# Patient Record
Sex: Male | Born: 1990 | Race: Asian | Hispanic: No | Marital: Married | Smoking: Former smoker
Health system: Southern US, Community
[De-identification: ages and names within clinical notes are randomized; demographics above are authoritative.]

## PROBLEM LIST (undated history)

## (undated) DIAGNOSIS — F329 Major depressive disorder, single episode, unspecified: Secondary | ICD-10-CM

## (undated) DIAGNOSIS — K7400 Hepatic fibrosis, unspecified: Secondary | ICD-10-CM

## (undated) DIAGNOSIS — Z87442 Personal history of urinary calculi: Secondary | ICD-10-CM

## (undated) DIAGNOSIS — Z973 Presence of spectacles and contact lenses: Secondary | ICD-10-CM

## (undated) DIAGNOSIS — B1910 Unspecified viral hepatitis B without hepatic coma: Secondary | ICD-10-CM

## (undated) DIAGNOSIS — Q379 Unspecified cleft palate with unilateral cleft lip: Secondary | ICD-10-CM

## (undated) DIAGNOSIS — K219 Gastro-esophageal reflux disease without esophagitis: Secondary | ICD-10-CM

## (undated) DIAGNOSIS — F32A Depression, unspecified: Secondary | ICD-10-CM

## (undated) DIAGNOSIS — Z974 Presence of external hearing-aid: Secondary | ICD-10-CM

## (undated) DIAGNOSIS — R011 Cardiac murmur, unspecified: Secondary | ICD-10-CM

## (undated) HISTORY — PX: MANDIBLE SURGERY: SHX707

## (undated) HISTORY — PX: CLEFT LIP REPAIR: SHX5225

## (undated) HISTORY — DX: Major depressive disorder, single episode, unspecified: F32.9

## (undated) HISTORY — DX: Depression, unspecified: F32.A

## (undated) HISTORY — DX: Unspecified viral hepatitis B without hepatic coma: B19.10

---
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
## 2009-01-24 ENCOUNTER — Ambulatory Visit (HOSPITAL_COMMUNITY): Payer: Self-pay | Admitting: Psychiatry

## 2009-05-23 ENCOUNTER — Ambulatory Visit (HOSPITAL_COMMUNITY): Payer: Self-pay | Admitting: Psychiatry

## 2009-09-19 ENCOUNTER — Ambulatory Visit (HOSPITAL_COMMUNITY): Payer: Self-pay | Admitting: Psychiatry

## 2010-07-13 ENCOUNTER — Emergency Department (HOSPITAL_COMMUNITY)
Admission: EM | Admit: 2010-07-13 | Discharge: 2010-07-13 | Disposition: A | Discharge status: Other Acute Inpatient Hospital | Payer: Self-pay | Source: Home / Self Care | Admitting: Emergency Medicine

## 2010-07-13 ENCOUNTER — Inpatient Hospital Stay (HOSPITAL_COMMUNITY)
Admission: AD | Admit: 2010-07-13 | Discharge: 2010-07-17 | Payer: Self-pay | Source: Home / Self Care | Attending: Psychiatry | Admitting: Psychiatry

## 2010-09-21 LAB — CBC
HCT: 46.8 % (ref 39.0–52.0)
MCHC: 35.3 g/dL (ref 30.0–36.0)
RBC: 5.27 MIL/uL (ref 4.22–5.81)
WBC: 6.8 10*3/uL (ref 4.0–10.5)

## 2010-09-21 LAB — ETHANOL: Alcohol, Ethyl (B): <5 mg/dL (ref 0–10)

## 2010-09-21 LAB — DIFFERENTIAL
Basophils Relative: 1 % (ref 0–1)
Eosinophils Absolute: 0.3 10*3/uL (ref 0.0–0.7)
Eosinophils Relative: 4 % (ref 0–5)
Lymphocytes Relative: 15 % (ref 12–46)
Lymphs Abs: 1 10*3/uL (ref 0.7–4.0)
Monocytes Absolute: 0.7 10*3/uL (ref 0.1–1.0)
Monocytes Relative: 10 % (ref 3–12)

## 2010-09-21 LAB — BASIC METABOLIC PANEL
BUN: 12 mg/dL (ref 6–23)
Creatinine, Ser: 0.72 mg/dL (ref 0.4–1.5)
Glucose, Bld: 106 mg/dL — ABNORMAL HIGH (ref 70–99)
Potassium: 4.2 mEq/L (ref 3.5–5.1)

## 2010-09-21 LAB — RAPID URINE DRUG SCREEN, HOSP PERFORMED
Amphetamines: NOT DETECTED
Benzodiazepines: NOT DETECTED
Opiates: NOT DETECTED

## 2010-11-24 NOTE — H&P (Signed)
NAME:  [name], [name] NO.:  [id number]   MEDICAL RECORD NO.:  [id number]          PATIENT TYPE:  INP   LOCATION:  0204                          [facility]ILITY[facility]  [facility]   PHYSICIAN:  Lalla Brothers, MDDATE OF BIRTH:  30-Jun-1991   DATE OF ADMISSION:  04/05/2007  DATE OF DISCHARGE:                       PSYCHIATRIC ADMISSION ASSESSMENT   IDENTIFICATION:  This 20-year, 21-month-old male, 11th grade student at  [employer], is admitted emergently voluntarily on  referral from his therapist, Maryln Manuel, Ph.D. for inpatient  stabilization and treatment of suicide risk and depression.  The patient  has suicidal ideation to walk into traffic in order to die, which has  been recurrent since April 03, 2007.  He is unable to resolve  impulses or contract for safety.  The patient has been more depressed,  reporting few friends but seeming to associate with peers who have  attempted suicide or had suicide pacts, whether now or in the last year.  The patient is not communicating well with adoptive parents.   HISTORY OF PRESENT ILLNESS:  The patient only offers an immediate  association or trigger of male friend telling him April 03, 2007  that she was sick and had to have surgery.  The patient himself has a  highly ingrown left great toenail which has persistent the last three  months.  He was scheduled for surgery by podiatrist on April 06, 2007 at 0900.  However, the patient has decompensated whether thinking  about past surgeries such as his cleft lip and palate surgery or bone  graft to the maxilla in 1992 and 2002, respectively.  The patient  appears to be significantly avoidant as well as depressed.  He does not  open up and talk directly about the nature of his intrapsychic  experience.  The patient had a friend that attempted suicide by overdose  last year and apparently there was a suicide pact for the patient  surrounding some  of this activity and others.  The patient has been in  therapy with Maryln Manuel, Ph.D. every 2-3 weeks.  He has also  been treated for [facility]  including Dr. Tacy Learn, MD and Jolene Provost, Ph.D.  The patient is  currently taking Focalin 15 mg XR as 2 every morning.  He also takes  Claritin 10 mg in the morning if needed for allergy and ibuprofen p.r.n.  for pain.  He has taken Metadate in the past.  The patient does not  clarify his access to content and affect in therapy.  He has had  significant trauma and medical care for cleft lip and palate, requiring  multiple surgeries apparently starting in 1992 with a maxillary bone  graft from the pelvis in 2002.   PAST MEDICAL HISTORY:  The patient had chicken pox as an infant.  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  Apparently, his hepatitis B medical  status was resolved by 1996.  He has had hearing aids for significant  hearing loss,  orthodontic braces, and eyeglasses.  He had multiple  surgeries for his cleft lip and palate with the last being a bone graft  in 2002.  He has eyeglasses.  He has allergic rhinitis.  He has had  trivial aortic stenosis murmur.  He is not sexually active.  He had a  wild raccoon bite to his finger in June of 2007 apparently receiving  rabies shots.  The patient seems to have put off treatment for his left  great toe which is ingrown, both medially and laterally.  He reports  some pain up the left leg at times.  The patient seems apprehensive  about the surgical correction of that ingrown toenail but he will not  say this directly.  Father seems adamant that the patient get this  problem taken care of while the patient seems to be saying he is not  emotionally prepared.  The patient has had no known seizure or syncope.  He has had no heart murmur or arrhythmia.   REVIEW OF SYSTEMS:  The patient  denies difficulty with gait, gaze or  continence.  He denies exposure to communicable disease or toxins.  He  denies rash, jaundice or purpura at this time other than his ingrown  left great toenail.  He has had trivial aortic stenosis on previous  exam.  He suggests that he does comply with preventive care in this  regard but seemed somewhat lax about most responsibilities.   IMMUNIZATIONS:  Up-to-date.   FAMILY HISTORY:  The patient was adopted from an orphanage at 20 year of  age in the [place]  He apparently has some Catholic and [address]  background.  He seems somewhat ambivalent now about his adoptive family  though they are certainly highly invested in him.  Adoptive father is  retired and adoptive mother teaches at [employer].   SOCIAL AND DEVELOPMENTAL HISTORY:  The patient is an 11th grade student  at [employer].  He likes to play the violin and  enjoys [place] and classical music.  Grades have diminished to C's in math  and science currently.  He had no legal consequences.  He denies any  substance abuse including tobacco.  He does not acknowledge any sexual  activity.   ASSETS:  The patient has survival skills, including in his previous  surgery.   MENTAL STATUS EXAM:  Height is 159 cm and weight is 51.5 kg.  Blood  pressure is 125/66 with heart rate of 75 (sitting) and 125/74 with heart  rate of 85 (standing).  He is right-handed.  The patient has residual  defect in the central upper lip.  He has orthodontic braces.  He has  hearing aids and glasses.  Cranial nerves 2-12 are otherwise intact  except for his diminished hearing and visual acuity.  Muscle strengths  and tone are normal.  There are no pathologic reflexes or soft  neurologic findings currently.  There are no abnormal involuntary  movements.  Gait and gaze are intact.  The patient may be slightly  slowed cognitively as well as significant psychomotor slowing.  Still,  it is  difficult to assess cognitive status as he is avoidant and  depressed and does not participate.  The patient does not face his  triggers for despair or his associations with past pain.  Rather, he  associates in ways, particularly with negative peers that have faced  problems but become overwhelmed.  The patient seems to rely on their  resolution but they cannot help him.  The patient does not look to the  adoptive parents or professionals significantly for these solutions.  He  has atypical depressive features that are severe currently with  rejection sensitivity, hypersensitivity to the comments or actions of  others, impulse control difficulty, leaden fatigue, and hysteroid  denial.  The patient has no hallucinations that can be determined and no  manic diathesis.  He has no definite post-traumatic flashbacks but there  is always a reenactment pattern and identification with morbid death  themes and suicide.  The patient has suicidal ideation again that is  recent though in relapse from last year.  He has suicidal ideation to  walk into traffic to be killed.  He is not homicidal.   IMPRESSION:  AXIS I:  Major depression, single episode, severe with  atypical features.  Attention-deficit hyperactivity disorder, combined-  subtype, moderate severity.  Identity disorder with avoidant features.  Other interpersonal problem.  Other specified family circumstances.  Parent-child problem.  AXIS II:  Diagnosis deferred.  AXIS III:  Left great toe ingrown toenail, allergic rhinitis, hearing  loss, eyeglasses, orthodontic braces, cleft lip and palate, allergy to  PENICILLIN and AMOXICILLIN, history of hepatitis B, history of trivial  aortic stenosis murmur.  AXIS IV:  Stressors:  School--moderate, acute and chronic; family--  severe, acute and chronic; phase of life--severe, acute and chronic;  medical-extreme, acute and chronic; peer relations--severe, acute and  chronic.  AXIS V:  GAF on  admission 33; highest in last year estimated at 60.   PLAN:  The patient is admitted for inpatient adolescent psychiatric and  multidisciplinary multimodal behavioral health treatment in a team-based  programmatic locked psychiatric unit.  Will continue Focalin and start  Prozac 20 mg daily as discussed at length with adoptive mother including  FDA guidelines and warnings.  Wound culture is planned for the toe  though the primary treatment need appears to be the podiatric surgery  debridement of the ingrown nail.  Cognitive behavioral therapy,  desensitization, anger mobilization and management, interpersonal  therapy, object relations, family therapy, individuation separation,  identity consolidation, social and communication skill training, problem-  solving and coping skill training, and learning strategies can be  undertaken.   ESTIMATED LENGTH OF STAY:  Six to seven days with target symptom for  discharge being stabilization of suicide risk and mood, stabilization of  anxious avoidance and generalization of the capacity for safe effective  participation in outpatient treatment.      Lalla Brothers, MD  Electronically Signed     GEJ/MEDQ  D:  04/06/2007  T:  04/07/2007  Job:  [id number]

## 2010-11-24 NOTE — Discharge Summary (Signed)
NAME:  [name], GOOSTREE NO.:  [id number]   MEDICAL RECORD NO.:  [id number]          PATIENT TYPE:  INP   LOCATION:  0204                          [facility]ILITY[facility]   PHYSICIAN:  Lalla Brothers, MDDATE OF BIRTH:  May 04, 1991   DATE OF ADMISSION:  04/05/2007  DATE OF DISCHARGE:  04/12/2007                               DISCHARGE SUMMARY   IDENTIFICATION:  This 20-year, 13-month-old male, 11th grade student at  [employer], was admitted emergently voluntarily on  referral from Maryln Manuel, Ph.D. for inpatient stabilization and  treatment of suicide risk and depression.  The patient had a suicide  plan to walk into traffic to die, recurrent since April 03, 2007,  unable to contract for safety at this time.  He has had difficulty with  peer relations and is not communicating well with adults including  adoptive parents, now seeming to associate with depressed or suicidal  peers including a suicide pact in the past.  The patient has previous  surgery for cleft lip and palate and apparently more surgery in the  future.  He is currently scheduled to have podiatric surgery on his left  great toe the day after admission and has been apparently putting this  off for three months.  The patient becomes more stressed and distressed  as this pattern continues.  He is being treated for [facility] at [facility], taking Focalin 30 mg XR every  morning.  For full details, please see the typed admission assessment.   SYNOPSIS OF PRESENT ILLNESS:  The patient was adopted in the [place]  from an orphanage at 20 year of age, having a history of hepatitis B in  early life and his cleft lip and palate surgery in 1992 and subsequently  with bone graft in 2002.  He was treated with Metadate in the past and  currently Focalin, working primarily with Dr. Virgel Paling.  He has a  history of cardiac murmur whether initially related  as aortic stenosis  or subsequently pulmonic stenosis.  The patient has no active medical  concerns currently except his ingrown left great toenail, his  orthodontic braces and upcoming need for further cleft lip surgery,  allergy to PENICILLIN and AMOXICILLIN, and some chronic hearing loss and  need for eyeglasses.  He does take Claritin occasionally for allergic  rhinitis.  The patient is closed to communication about his emotional  concerns.  He is intense and at times perfectionistic though he has  musical skills and is interested in church youth group.  He is not  studying or getting his school work in on time.  No other definite  family history is known.   INITIAL MENTAL STATUS EXAM:  The patient was slowed cognitively,  consistent with psychomotor slowing from depression.  However, he is  genuine regarding concern for peer problems though may become  overwhelmed with these.  He is not looking to adults in his life for  support or guidance.  He has atypical depressive features with rejection  sensitivity, hypersensitivity to the comments or actions of others,  leaden fatigue and hysteroid denial.  He is not self-injurious or  assaultive but has been experiencing suicidal ideation almost in relapse  from last year with morbid themes of death such as walking into traffic.  He is right-handed and neurological exam was otherwise intact.   LABORATORY FINDINGS:  CBC on admission was normal except hemoglobin  slightly elevated at 16.6 with upper limit of normal 16 consistent with  hemoconcentration.  Total white count was normal at 5900, MCV at 85.5  and platelet count 199,000.  Hepatic function panel was normal with  albumin 4.3, total bilirubin 1, AST 20, ALT 13 and GGT 20.  Basic  metabolic panel was normal with sodium 143, potassium 4, fasting glucose  87, creatinine 0.85 and calcium 9.8.  Free T4 was normal at 1.38 and TSH  at 1.899.  RPR was nonreactive and urine probe for  gonorrhea and  chlamydia trachomatis by DNA amplification were both negative.  Urine  drug screen was negative with creatinine of 242 mg/dL documenting  adequate specimen.  Wound culture of the left great toe final report  noted a few Staphylococcus aureus resistant to penicillin and  tetracycline, otherwise sensitive including to oxacillin.  There were  also a few Pseudomonas Auerginosa sensitive to all antibiotics tested.  EKG interpreted by Dr. Michelle Piper Degent was normal sinus rhythm, borderline  EKG due to incomplete right bundle branch block with QRS of 100  milliseconds, QTC of 411 milliseconds and rate of 89 milliseconds with  PR of 112 milliseconds.   HOSPITAL COURSE AND TREATMENT:  General medical exam by Jorje Guild, PA-C  noted allergy to AMOXICILLIN and a p.r.n. use of ibuprofen when needed.  The patient reported that biological mother was mentally retarded and  that he has some conflicts with adoptive father.  He has eyeglasses and  diminished hearing.  He apparently received growth hormone in the past.  BMI is 20.4.  He has some facial acne.  He has continued significant  underbite and deficit in the upper lip.  He has hearing aids bilaterally  and orthodontic braces.  Phone discussion with his podiatrist addressed  culture, Septra antibiotic in the interim and podiatric surgery upon  discharge for the ingrown left great toenail affecting the medial and  lateral sides.  The wound was cleaned and dressed daily.  Vital signs  were normal throughout hospital stay, remaining afebrile with maximum  temperature 98.2.  His height was 159 cm and admission weight was 51.5  kg with discharged 49 kg with the patient disapproving of hospital food.  Initial blood pressure (supine) was 117/73 with heart rate of 51 and  standing blood pressure was 115/74 with heart rate of 67.  At the time  of discharge, supine blood pressure was 103/57 with heart rate of 92 and  standing blood pressure 109/62  with heart rate of 123.  The patient did  have some tachycardia most prominent on April 10, 2007 at which time  his sitting blood pressure was 155/86 with heart rate of 135 and  standing blood pressure was 118/79 with heart rate of 154.  His Focalin  was decreased from 30 mg to 20 mg XR initially and finally to 15 mg XR  as Prozac 20 mg every morning was started.  The Prozac was switched to  bedtime as the family finds this a convenient time for also taking their  medications.  The patient reported being initially drowsy on the Prozac.  At the time of discharge,  the drowsiness was resolved but the patient  had some stereotypic oral movements that were not typical tics but  rather appeared to be stereotypies that would likely remit.  The patient  had no other side effects from the Prozac.  The patient worked  diligently in the hospital treatment program and was able to focus his  improvement in skill and confidence upon family work, particularly in  the final family therapy session.  Adoptive father was willing to  relinquish his requirement for immediate surgery on the left great toe  and to allow this to be completed after the patient's discharge.  Parents reworked many of the experiences involved in the care of the  patient over the years and all assessed the patient's maturity and  progress over time though frustration sometime a persisting problem.  The patient was pleased overall with his progress and prepared for  discharge.  He had no remaining suicidal ideation, and was discharged in  improved condition with a copy of his laboratory testing to take to his  upcoming appointment with podiatrist and then pediatrician in the  future.  He required no seclusion or restraint during the hospital stay.   FINAL DIAGNOSES:  AXIS I:  Major depression, single episode, severe with  atypical features.  Attention-deficit hyperactivity disorder, combined-  subtype, with moderate severity.   Other interpersonal problem.  Other  specified family circumstances.  Parent-child problem.  AXIS II:  Diagnosis deferred.  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  AXIS IV:  Stressors:  School--moderate, acute and chronic; family--  severe, acute and chronic; phase of life--severe, acute and chronic;  medical--extreme, acute and chronic; peer relations--severe, acute and  chronic.  AXIS V:  GAF on admission 33; highest in last year 60; discharge GAF 53.   CONDITION ON DISCHARGE:  The patient was discharged to adoptive parents  in improved condition on a weight gain diet.  He has no restrictions on  physical activity other than those requiring upcoming care such as for  his left great toenail and cleft palate and cleft lip repair.  He has no  other activity restrictions and wound care will be deferred subsequently  to his podiatric surgery in the next day or two with Dr. Tinnie Gens  Petrinitz with a copy of labs and EKG sent with the patient and family  for upcoming procedure.  He has ongoing pediatric care with Dr. Talmage Nap.  Crisis and safety plans are outlined if needed as well as medication  education being provided including FDA guidelines and warnings.  He is  prescribed the following medication.   DISCHARGE MEDICATIONS:  1. Focalin 15 mg XR capsule every morning; quantity #30 with no refill      prescribed, having been reduced from 30 mg XR prior to admission.  2. Prozac 20 mg every bedtime as the oral solution 20 mg per teaspoon       being prescribed 150 cc with one refill.  3. Claritin 10 mg daily as needed for allergy symptoms; own home      supply.  4. Ibuprofen; as per own home supply for pain if needed.  5. He completed six days of Septra DS, changed to Septra suspension on      a b.i.d. regimen during his hospital stay.   FOLLOWUP:  The patient will see Dr. Everlene Other for therapy April 17, 2007 at 1600.  He will see Dr. Betti Cruz at [facility] for  psychiatric follow-up with parents required to call to schedule that  appointment at [phone].      Lalla Brothers, MD  Electronically Signed     GEJ/MEDQ  D:  04/14/2007  T:  04/14/2007  Job:  [id number]

## 2010-11-27 NOTE — Op Note (Signed)
NAME:  [name], [name] NO.:  [id number]   MEDICAL RECORD NO.:  [id number]                   PATIENT TYPE:  OIB   LOCATION:  2873                                 FACILITY:  [facility]   PHYSICIAN:  Prabhakar D. Pendse, M.D.           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   OPERATION PERFORMED:  Excision of left preauricular lesion and layered  repair.   SURGEON:  Prabhakar D. Levie Heritage, M.D.   ASSISTANT:  Nurse.   ANESTHESIA:  Nurse.   INDICATIONS FOR PROCEDURE:  This young 20 year old patient had excision of  left preauricular cyst about two years ago.  The wound healed with keloid  like lesion; however, recently he has been complaining of pain and  discomfort.  Ultrasound examination was carried out which revealed findings  consistent with possible recurrence of the cyst, hence excision was planned.   DESCRIPTION OF PROCEDURE:  Patient under satisfactory general anesthesia in  lateral position, left face region was thoroughly prepped and draped in the  usual manner.  An elliptical incision was made around the lesion.  Skin and  subcutaneous tissue were incised.  Bleeders were individually clamped, cut  and electrocoagulated.  By blunt and sharp dissection incision carried down  to the fascia.  Possible superficial parotid gland capsule was adherent to  the lesion. The entire lesion was excised.  Hemostasis accomplished.  The  deeper layers approximated with 4-0 Vicryl.  Skin closed with 5-0 nylon  interrupted as well as running interlocking sutures.  Occlusive dressing  applied.  Throughout the procedure,  the patient's vital signs remained  stable.  The patient withstood the procedure well and  was transferred to  the recovery room in satisfactory general condition.                                               Prabhakar D. Levie Heritage, M.D.    PDP/MEDQ  D:  07/04/2003  T:  07/05/2003  Job:  [id number]   cc:   Alden Server L. Peter Congo, M.D.  [address]  [place]  [place] [address]  Fax: [phone]

## 2011-04-22 LAB — CBC
Hemoglobin: 16.6 — ABNORMAL HIGH
MCHC: 35.4
MCV: 85.5
RBC: 5.47

## 2011-04-22 LAB — RPR: RPR Ser Ql: NONREACTIVE

## 2011-04-22 LAB — HEPATIC FUNCTION PANEL
AST: 20
Albumin: 4.3
Total Bilirubin: 1
Total Protein: 6.8

## 2011-04-22 LAB — BASIC METABOLIC PANEL
CO2: 29
Chloride: 106
Creatinine, Ser: 0.85

## 2011-04-22 LAB — WOUND CULTURE: Gram Stain: NONE SEEN

## 2011-04-22 LAB — GAMMA GT: GGT: 20

## 2011-04-22 LAB — DRUGS OF ABUSE SCREEN W/O ALC, ROUTINE URINE
Benzodiazepines.: NEGATIVE
Marijuana Metabolite: NEGATIVE
Methadone: NEGATIVE
Opiate Screen, Urine: NEGATIVE

## 2011-04-22 LAB — DIFFERENTIAL
Basophils Relative: 1
Lymphocytes Relative: 24

## 2012-07-24 ENCOUNTER — Telehealth (HOSPITAL_COMMUNITY): Payer: Self-pay

## 2014-04-19 ENCOUNTER — Encounter: Payer: Self-pay | Admitting: *Deleted

## 2014-08-03 ENCOUNTER — Emergency Department (HOSPITAL_COMMUNITY): Payer: BLUE CROSS/BLUE SHIELD

## 2014-08-03 ENCOUNTER — Encounter (HOSPITAL_COMMUNITY): Payer: Self-pay | Admitting: Emergency Medicine

## 2014-08-03 ENCOUNTER — Emergency Department (HOSPITAL_COMMUNITY)
Admission: EM | Admit: 2014-08-03 | Discharge: 2014-08-03 | Disposition: A | Discharge status: Home / Self Care | Payer: BLUE CROSS/BLUE SHIELD | Attending: Emergency Medicine | Admitting: Emergency Medicine

## 2014-08-03 DIAGNOSIS — Z8619 Personal history of other infectious and parasitic diseases: Secondary | ICD-10-CM | POA: Diagnosis not present

## 2014-08-03 DIAGNOSIS — Z23 Encounter for immunization: Secondary | ICD-10-CM | POA: Insufficient documentation

## 2014-08-03 DIAGNOSIS — S0031XA Abrasion of nose, initial encounter: Secondary | ICD-10-CM | POA: Diagnosis not present

## 2014-08-03 DIAGNOSIS — W540XXA Bitten by dog, initial encounter: Secondary | ICD-10-CM | POA: Insufficient documentation

## 2014-08-03 DIAGNOSIS — Y9389 Activity, other specified: Secondary | ICD-10-CM | POA: Diagnosis not present

## 2014-08-03 DIAGNOSIS — Y998 Other external cause status: Secondary | ICD-10-CM | POA: Insufficient documentation

## 2014-08-03 DIAGNOSIS — Z8659 Personal history of other mental and behavioral disorders: Secondary | ICD-10-CM | POA: Diagnosis not present

## 2014-08-03 DIAGNOSIS — Y9289 Other specified places as the place of occurrence of the external cause: Secondary | ICD-10-CM | POA: Diagnosis not present

## 2014-08-03 DIAGNOSIS — Z87891 Personal history of nicotine dependence: Secondary | ICD-10-CM | POA: Diagnosis not present

## 2014-08-03 DIAGNOSIS — S0992XA Unspecified injury of nose, initial encounter: Secondary | ICD-10-CM | POA: Diagnosis present

## 2014-08-03 DIAGNOSIS — T148XXA Other injury of unspecified body region, initial encounter: Secondary | ICD-10-CM

## 2014-08-03 MED ORDER — KETOROLAC TROMETHAMINE 30 MG/ML IJ SOLN: 30.0000 mg | Freq: Once | INTRAMUSCULAR | Status: DC

## 2014-08-03 MED ORDER — BACITRACIN 500 UNIT/GM EX OINT
1.0000 "application " | TOPICAL_OINTMENT | Freq: Two times a day (BID) | CUTANEOUS | Status: DC
  Filled 2014-08-03 (×3): qty 0.9

## 2014-08-03 MED ORDER — ONDANSETRON HCL 4 MG/2ML IJ SOLN: 4.0000 mg | Freq: Once | INTRAMUSCULAR | Status: DC

## 2014-08-03 MED ORDER — TETANUS-DIPHTH-ACELL PERTUSSIS 5-2.5-18.5 LF-MCG/0.5 IM SUSP
0.5000 mL | Freq: Once | INTRAMUSCULAR | Status: AC
  Administered 2014-08-03: 0.5 mL via INTRAMUSCULAR
  Filled 2014-08-03: qty 0.5

## 2014-08-03 MED ORDER — AMOXICILLIN-POT CLAVULANATE 875-125 MG PO TABS: 1.0000 | ORAL_TABLET | Freq: Two times a day (BID) | ORAL | Status: DC

## 2014-08-03 MED ORDER — HYDROMORPHONE HCL 1 MG/ML IJ SOLN: 1.0000 mg | INTRAMUSCULAR | Status: DC | PRN

## 2014-08-03 MED ORDER — AMOXICILLIN-POT CLAVULANATE 875-125 MG PO TABS
1.0000 | ORAL_TABLET | Freq: Once | ORAL | Status: AC
  Administered 2014-08-03: 1 via ORAL
  Filled 2014-08-03: qty 1

## 2014-08-03 NOTE — Discharge Instructions (Signed)
Please call your doctor for a followup appointment within 24-48 hours. When you talk to your doctor please let them know that you were seen in the emergency department and have them acquire all of your records so that they can discuss the findings with you and formulate a treatment plan to fully care for your new and ongoing problems. Please follow-up with your primary care provider to be seen and reassessed Please continue to wash wounds with warm water and soap every day, continue to apply bacitracin ointment Please continue to monitor symptoms closely and if symptoms are to worsen or change (fever greater than 101, chills, sweating, nausea, vomiting, chest pain, shortness of breathe, difficulty breathing, weakness, numbness, tingling, worsening or changes to pain pattern, swelling, headache, dizziness, drainage, pus, redness spreads to the face) please report back to the Emergency Department immediately.    Animal Bite An animal bite can result in a scratch on the skin, deep open cut, puncture of the skin, crush injury, or tearing away of the skin or a body part. Dogs are responsible for most animal bites. Children are bitten more often than adults. An animal bite can range from very mild to more serious. A small bite from your house pet is no cause for alarm. However, some animal bites can become infected or injure a bone or other tissue. You must seek medical care if:  The skin is broken and bleeding does not slow down or stop after 15 minutes.  The puncture is deep and difficult to clean (such as a cat bite).  Pain, warmth, redness, or pus develops around the wound.  The bite is from a stray animal or rodent. There may be a risk of rabies infection.  The bite is from a snake, raccoon, skunk, fox, coyote, or bat. There may be a risk of rabies infection.  The person bitten has a chronic illness such as diabetes, liver disease, or cancer, or the person takes medicine that lowers the immune  system.  There is concern about the location and severity of the bite. It is important to clean and protect an animal bite wound right away to prevent infection. Follow these steps:  Clean the wound with plenty of water and soap.  Apply an antibiotic cream.  Apply gentle pressure over the wound with a clean towel or gauze to slow or stop bleeding.  Elevate the affected area above the heart to help stop any bleeding.  Seek medical care. Getting medical care within 8 hours of the animal bite leads to the best possible outcome. DIAGNOSIS  Your caregiver will most likely:  Take a detailed history of the animal and the bite injury.  Perform a wound exam.  Take your medical history. Blood tests or X-rays may be performed. Sometimes, infected bite wounds are cultured and sent to a lab to identify the infectious bacteria.  TREATMENT  Medical treatment will depend on the location and type of animal bite as well as the patient's medical history. Treatment may include:  Wound care, such as cleaning and flushing the wound with saline solution, bandaging, and elevating the affected area.  Antibiotics.  Tetanus immunization.  Rabies immunization.  Leaving the wound open to heal. This is often done with animal bites, due to the high risk of infection. However, in certain cases, wound closure with stitches, wound adhesive, skin adhesive strips, or staples may be used. Infected bites that are left untreated may require intravenous (IV) antibiotics and surgical treatment in the hospital. HOME  CARE INSTRUCTIONS  Follow your caregiver's instructions for wound care.  Take all medicines as directed.  If your caregiver prescribes antibiotics, take them as directed. Finish them even if you start to feel better.  Follow up with your caregiver for further exams or immunizations as directed. You may need a tetanus shot if:  You cannot remember when you had your last tetanus shot.  You have  never had a tetanus shot.  The injury broke your skin. If you get a tetanus shot, your arm may swell, get red, and feel warm to the touch. This is common and not a problem. If you need a tetanus shot and you choose not to have one, there is a rare chance of getting tetanus. Sickness from tetanus can be serious. SEEK MEDICAL CARE IF:  You notice warmth, redness, soreness, swelling, pus discharge, or a bad smell coming from the wound.  You have a red line on the skin coming from the wound.  You have a fever, chills, or a general ill feeling.  You have nausea or vomiting.  You have continued or worsening pain.  You have trouble moving the injured part.  You have other questions or concerns. MAKE SURE YOU:  Understand these instructions.  Will watch your condition.  Will get help right away if you are not doing well or get worse. Document Released: 03/16/2011 Document Revised: 09/20/2011 Document Reviewed: 03/16/2011 [facility] Patient Information 2015 [place], [place]. This information is not intended to replace advice given to you by your health care provider. Make sure you discuss any questions you have with your health care provider.   Emergency Department Resource Guide 1) Find a Doctor and Pay Out of Pocket Although you won't have to find out who is covered by your insurance plan, it is a good idea to ask around and get recommendations. You will then need to call the office and see if the doctor you have chosen will accept you as a new patient and what types of options they offer for patients who are self-pay. Some doctors offer discounts or will set up payment plans for their patients who do not have insurance, but you will need to ask so you aren't surprised when you get to your appointment.  2) Contact Your Local Health Department Not all health departments have doctors that can see patients for sick visits, but many do, so it is worth a call to see if yours does. If you don't  know where your local health department is, you can check in your phone book. The CDC also has a tool to help you locate your state's health department, and many state websites also have listings of all of their local health departments.  3) Find a Walk-in Clinic If your illness is not likely to be very severe or complicated, you may want to try a walk in clinic. These are popping up all over the country in pharmacies, drugstores, and shopping centers. They're usually staffed by nurse practitioners or physician assistants that have been trained to treat common illnesses and complaints. They're usually fairly quick and inexpensive. However, if you have serious medical issues or chronic medical problems, these are probably not your best option.  No Primary Care Doctor: - Call Health Connect at  [phone] - they can help you locate a primary care doctor that  accepts your insurance, provides certain services, etc. - Physician Referral Service- [phone]  Chronic Pain Problems: Organization         Address  Phone   Notes  [facility]  [phone] Patients need to be referred by their primary care doctor.   Medication Assistance: Organization         Address  Phone   Notes  [facility] Medication [facility] [address] [place], [place] [address] [phone] --Must be a resident of [facility] -- Must have NO insurance coverage whatsoever (no Medicaid/ Medicare, etc.) -- The pt. MUST have a primary care doctor that directs their care regularly and follows them in the community   [facility]  [phone]   [employer]  [phone]    Agencies that provide inexpensive medical care: Organization         Address  Phone   Notes  [facility]  [phone]   [facility]    [phone]   [facility] [address] [place], [place] [address] [phone]   [facility] [address], [place] [phone]   Planned Parenthood    [phone]   [facility]    [phone]   Community Health and [facility]  [address], [place] Phone:  [phone], Fax:  [phone] Hours of Operation:  9 am - 6 pm, M-F.  Also accepts Medicaid/Medicare and self-pay.  [facility]  [address], [place] Phone: [phone], Fax: [phone]. Hours of Operation:  8:30 am - 5:30 pm, M-F.  Also accepts Medicaid and self-pay.  [facility] [place] [address] [place] Phone: [phone]   [facility] [address] Natasha Bence [place], [place] [phone], Ext. [phone] Mondays & Thursdays: 7-9 AM.  First 15 patients are seen on a first come, first serve basis.    Medicaid-accepting [facility] Providers:  Organization         Address  Phone   Notes  [facility] [address], [place] [phone] Also accepts self-pay patients.  [facility] [address] Laurell Josephs [place], [place]  [phone]   [facility] [address], [place] [phone]   [facility] [address], [place] [phone]   Renaye Rakers [address], [place]   [phone] Only accepts [place] Access [place] patients after they have their name applied to their card.   Self-Pay (no insurance) in [facility]:  Organization         Address  Phone   Notes  Sickle Cell Patients, [facility] [address] [place], [place] [phone]   [facility] [address] [place], [place] [phone]   [facility] [place]  [address], [place] [phone]   [facility]/Dr. Osei-Bonsu  [address] [place] or [address], [place] [phone] Phone  number for both [place] and [place] locations is the same.  Urgent Medical and [facility] [address], [place] [phone]   [facility] [address] [place] or [address] [phone]   [facility] [address], [place] [phone], phone; [phone], fax Sees patients 1st and 3rd Saturday of every month.  Must not qualify for public  or private insurance (i.e. Medicaid, Medicare, [place] Health Choice, Veterans' Benefits)  Household income should be no more than 200% of the poverty level The clinic cannot treat you if you are pregnant or think you are pregnant  Sexually transmitted diseases are not treated at the clinic.    Dental Care: Organization         Address  Phone  Notes  [facility] [address], [place] [phone] Accepts children up to age 25 who are enrolled in [place] or [place] Health Choice; pregnant women with a Medicaid card; and children who have applied for Medicaid or [place] Health Choice, but were declined, whose parents can pay a reduced fee at time of service.  [facility] [place]  [address], [place] [phone] Accepts children up to age 25 who are enrolled in [place] or [place] Health Choice; pregnant women with a Medicaid card; and children who have applied for Medicaid or  Health Choice, but were declined, whose parents can pay a reduced fee at time of service.  [facility]  [address], [place] [phone] Patients are seen by appointment only. Walk-ins are not accepted. [facility] will see patients 25 years of age and older. Monday - Tuesday (8am-5pm) Most Wednesdays (8:30-5pm) $30 per visit, cash only  [facility]  [address], [facility] [place] [phone] Patients are seen by appointment only.  Walk-ins are not accepted. [facility] will see patients 25 years of age and older. One Wednesday Evening (Monthly: Volunteer Based).  $30 per visit, cash only  [employer] [facility] [facility]  [phone] for adults; Children under age 274, call Graduate Pediatric Dentistry at [phone]. Children aged 64-14, please call [phone] to request a pediatric application.  Dental services are provided in all areas of dental care including fillings, crowns and bridges, complete and partial dentures, implants, gum treatment, root canals, and extractions. Preventive care is also provided. Treatment is provided to both adults and children. Patients are selected via a lottery and there is often a waiting list.   [facility] [address], [place]  [phone] [email or web address]   [facility] [address], [place], [place] [phone], Ext. [phone] Second and Fourth Thursday of each month, opens at 6:30 AM; Clinic ends at 9 AM.  Patients are seen on a first-come first-served basis, and a limited number are seen during each clinic.   [facility]  [address], [place], [place] [phone]   Eligibility Requirements You must have lived in [place], [place], or [place] counties for at least the last three months.   You cannot be eligible for state or federal sponsored [place]healthcare insurance, including CIGNAVeterans Administration, [place]icaid, or [employer]icare.   You generally cannot be eligible for healthcare insurance through your employer.    How to apply: Eligibility screenings are held every Tuesday and Wednesday afternoon from 1:00 pm until 4:00 pm. You do not need an appointment for the interview!  [facility] [address], [place], [place] [phone]   [facility]  [phone]   [facility]  [phone]   [facility]  [phone]    Behavioral Health  Resources in the Community: Intensive Outpatient Programs Organization         Address  Phone  Notes  [facility] [address] [place], [place] [phone]   [facility] Outpatient [address] [place], [place] [phone]   [facility] [address], [place], [place]  [phone]   [facility] [address],  [place], [place] [phone] or [phone]   Substance Abuse Resources Organization         Address  Phone  Notes  [facility] Drug Services  [phone]   [facility]  [phone]   [facility]  [phone]   Floydene Flock  [phone]   Residential & Outpatient Substance Abuse Program  [phone]   Psychological Services Organization         Address  Phone  Notes  [facility]  [phone]   [facility]  [phone]   [facility] [address], [place] [phone] or [phone]    Mobile Crisis Teams Organization         Address  Phone  Notes  [facility], Mobile Crisis Care Unit  [phone]   [facility]  [address] [place], [place] [phone]   Doristine Locks [address] [place] [phone]    Self-Help/Support Groups Organization         Address  Phone             Notes  [facility] [employer] - variety of support groups  336- I7437963 Call for more information  Narcotics Anonymous (NA), [facility] [address], [employer] [place]  2 meetings at this location   Statistician         Address  Phone  Notes  [facility] [address],    [place]  [phone]   [facility]  [address], [place] [address], [place], [place] [phone]   [facility] [address] [place], [place] [phone] Admissions: 8am-3pm M-F  [facility] [address],    [place], [place] [phone]   [facility] [address] [place], [place], [place] [phone]   [facility] [address]  [place], [place] [phone]   [facility] - Intensive Outpatient [address]ure[address] Joseph[address], [place], [place] [phone]   [facility] ([facility] [address]  [place], [place] [phone] or [phone]   [facility] ([facility]) [address] [place], [place] [phone] Accepts Medicaid  [facility] [address]  [place] [phone] Substance Abuse/Addiction Treatment   [facility] Organization         Address  Phone  Notes  [facility]  [phone]   Angie Fava, PhD [address] Ervin Knack [place], [place]   [phone] or [phone]   [facility]   [address] [place], [place] [phone]   [facility] [address], [place], [place] [phone] Insurance/Medicaid/sponsorship through [facility] Families [address]                                    [place], [place] [phone] Therapy/tele-psych/case  [facility] [address], [place] [phone]    Dr. Lolly Mustache  [phone]   [facility]  [employer] [facility]  Dept. 1) [address], [place] °2) [address], [place] °3)  [address], [place] [phone] °([phone] ° °([phone]) [phone]   °[place] Child Abuse Hotline [phone]) 342-1394 or ([phone] (After Hours)     ° ° ° °

## 2014-08-03 NOTE — ED Notes (Signed)
Pt to CT and returned.  

## 2014-08-03 NOTE — ED Provider Notes (Signed)
CSN: [id number]     Arrival date & time 08/03/14  1216 History   First MD Initiated Contact with Patient 08/03/14 1226     Chief Complaint  Patient presents with  . Animal Bite     (Consider location/radiation/quality/duration/timing/severity/associated sxs/prior Treatment) The history is provided by the patient. No language interpreter was used.  [name] is a 24 year old male with past history of depression, hepatitis B presents to the emergency department regarding a dog bite that occurred yesterday evening at approximately 6:30 PM. Patient reported that he was playing with his friend's dog, reported that the dog got scared and bit his nose. Patient reported that there was a lot of bleeding, was controlled with applying pressure. Patient stated that he's been cleaning the wounds with warm water and soap and applying Neosporin. Stated that he wanted to come to the ED today to be seen regarding possible nose damage. Father who accompanies patient, reports that he is concerned that the patient has cartilage damage. Patient reported that the dog is up-to-date with vaccinations-rabies vaccine is up-to-date. Patient reported that he has not received his tetanus shot less than 5 years ago-reported that this was more than 5 years ago. Denied breathing issues, issues with nostrils, blurred vision, subtle loss of vision, neck pain, neck stiffness, swelling , drainage. PCP none  Past Medical History  Diagnosis Date  . Hepatitis B   . Depression    History reviewed. No pertinent past surgical history. Family History  Problem Relation Age of Onset  . Hyperlipidemia Mother    History  Substance Use Topics  . Smoking status: Former Smoker    Quit date: 04/19/2012  . Smokeless tobacco: Not on file  . Alcohol Use: No    Review of Systems  Constitutional: Negative for fever and chills.  Eyes: Negative for visual disturbance.  Skin: Positive for wound.  Neurological: Negative for  weakness and numbness.      Allergies  Review of patient's allergies indicates no known allergies.  Home Medications   Prior to Admission medications   Medication Sig Start Date End Date Taking? Authorizing Provider  loratadine (CLARITIN) 10 MG tablet Take 10 mg by mouth daily as needed for allergies.    Yes Historical Provider, MD  amoxicillin-clavulanate (AUGMENTIN) 875-125 MG per tablet Take 1 tablet by mouth 2 (two) times daily. One po bid x 7 days 08/03/14   Cheril Slattery, PA-C   BP 141/73 mmHg  Pulse 108  Temp(Src) 98 F (36.7 C) (Oral)  Resp 17  SpO2 100% Physical Exam  Constitutional: He is oriented to person, place, and time. He appears well-developed and well-nourished. No distress.  HENT:  Head: Normocephalic and atraumatic.  Mouth/Throat: Oropharynx is clear and moist. No oropharyngeal exudate.  Superficial abrasions identified to the nose, tip of the nose and left nostril. Negative active draining or bleeding noted at this time. Abrasions have scabbed over. Mild erythema surrounds the superficial abrasions. Negative pain upon palpation. Negative septal hematoma. Nares intact with patency. Negative active drainage or bleeding noted to the nearest bilaterally.  Cleft palate repair identified.  Eyes: Conjunctivae and EOM are normal. Pupils are equal, round, and reactive to light. Right eye exhibits no discharge. Left eye exhibits no discharge.  Neck: Normal range of motion. Neck supple. No tracheal deviation present.  Negative neck stiffness Negative nuchal rigidity Negative cervical lymphadenopathy  Cardiovascular: Normal rate, regular rhythm and normal heart sounds.  Exam reveals no friction rub.   No murmur heard. Pulmonary/Chest:  Effort normal and breath sounds normal. No respiratory distress. He has no wheezes. He has no rales.  Musculoskeletal: Normal range of motion.  Lymphadenopathy:    He has no cervical adenopathy.  Neurological: He is alert and oriented  to person, place, and time. No cranial nerve deficit. He exhibits normal muscle tone. Coordination normal.  Cranial nerves III-XII grossly intact Negative facial droop Negative slurred speech Negative aphasia Patient responds to questions appropriately Patient follows commands well  Skin: Skin is warm and dry. No rash noted. He is not diaphoretic. No erythema.  Psychiatric: He has a normal mood and affect. His behavior is normal. Thought content normal.  Nursing note and vitals reviewed.   ED Course  Procedures (including critical care time)      Labs Review Labs Reviewed - No data to displayImaging Review Ct Maxillofacial Wo Cm  08/03/2014   CLINICAL DATA:  Dog bite to the nose yesterday.  EXAM: CT MAXILLOFACIAL WITHOUT CONTRAST  TECHNIQUE: Multidetector CT imaging of the maxillofacial structures was performed. Multiplanar CT image reconstructions were also generated. A small metallic BB was placed on the right temple in order to reliably differentiate right from left.  COMPARISON:  None.  FINDINGS: There is prior surgery in the anterior alveolar ridge and inferior maxillary sinuses with multiple surgical wires and mini plates. There is no acute fracture. No soft tissue foreign body is evident. No soft tissue gas or fluid collection is evident. There is rightward septal curvature and beaking. There is minimal membrane thickening in the maxillary sinuses and in the left sphenoid sinus. No paranasal sinus bladder or fluid levels are evident.  IMPRESSION: No evidence of significant acute traumatic injury.   Electronically Signed   By: Ellery Plunkaniel R Mitchell M.D.   On: 08/03/2014 14:23     EKG Interpretation None       2:57 PM This provider spoke with the patient regarding vaccination with dog. Discussed that rabies vaccine needs to be verified. Patient to call friend. Father with like to speak with police officer regarding possible quarantine the dog. This provider spoke with the police  officer who is going to speak with the father and patient.  3:14 PM Patient trying to call Animal Control regarding rabies vaccine.   3:37 PM patient reported that he got in contact with the dog's owner area and reported that the dog's rabies vaccine is up-to-date-last rabies vaccine was in 2015. Patient does not want to press charges or have the dog quarantined. Patient reported that he feels well and is ready to go home.  MDM   Final diagnoses:  Animal bite    Medications  bacitracin ointment 1 application (not administered)  Tdap (BOOSTRIX) injection 0.5 mL (0.5 mLs Intramuscular Given 08/03/14 1352)  amoxicillin-clavulanate (AUGMENTIN) 875-125 MG per tablet 1 tablet (1 tablet Oral Given 08/03/14 1527)   Filed Vitals:   08/03/14 1220  BP: 141/73  Pulse: 108  Temp: 98 F (36.7 C)  TempSrc: Oral  Resp: 17  SpO2: 100%   CT maxillofacial without contrast no evidence of significant acute traumatic injury. No soft tissue foreign bodies evident. No soft tissue gas or fluid collection is evident. Patient presenting to the ED with a animal bite, dog bite that occurred last night at approximately 6:30 PM. Superficial abrasions identified to the nose that are currently scabbed over. Negative active drainage or bleeding noted. Negative signs of cellulitic infection. CT maxillofacial unremarkable - negative for abscess, foreign body, acute traumatic injury. Wound cleaned and antibiotic  ointment placed. Patient updated on tetanus shot. Dose of augmented administered in the ED setting. Discussed with patient that he should have been seen in the ED setting yesterday, directly after the event for proper wound care and preventative measures towards infection. Discussed the possibility of infection is high since this is 18 hours past time of event-patient understood. Educated patient on signs and symptoms of infection. Patient reported that the dog is up-to-date with vaccinations-reported that he spoke  with owner reported that the rabies vaccine was last administered in 2015. Patient does not want to press charges or have dog quarantined. Patient reported that if anything changes to report back to the ED immediately. Patient stable, afebrile. Patient septic appearing. Discharged patient. Discharge patient with antibiotics. Discussed with patient to continue to keep site clean. Referred to PCP, plastics, ENT. Discussed with patient to closely monitor symptoms and if symptoms are to worsen or change to report back to the ED - strict return instructions given.  Patient agreed to plan of care, understood, all questions answered.   Raymon Mutton, PA-C 08/03/14 1543  4:09 PM This provider received a phone call from pharmacist from [facility], Phil. As per pharmacist reported that the patient does have any allergy towards penicillin. This provider reported that the patient needs to report back to the ED immediately to be assessed. This provider received a phone call back from Phil, [facility] pharmacist - reported that he spoke with patient's mother who reported that the patient only had stomach upset with penicillins. Stated that patient had an intolerance, not an allergic reaction - denied anaphylactic reaction. Pharmacist reported okay for Augmentin.   Raymon Mutton, PA-C 08/03/14 2216  Rolland Porter, MD 08/04/14 [phone]

## 2014-08-03 NOTE — ED Notes (Signed)
Per pt, states dog bit him on nose yesterday-dog up to date on rabies/shots-just wants to be evaluated for possible nose damage

## 2014-08-06 ENCOUNTER — Other Ambulatory Visit (HOSPITAL_COMMUNITY): Payer: Self-pay | Admitting: Gastroenterology

## 2014-08-06 DIAGNOSIS — K7040 Alcoholic hepatic failure without coma: Secondary | ICD-10-CM

## 2014-09-04 ENCOUNTER — Ambulatory Visit (HOSPITAL_COMMUNITY)
Admission: RE | Admit: 2014-09-04 | Discharge: 2014-09-04 | Disposition: A | Discharge status: Home / Self Care | Payer: BLUE CROSS/BLUE SHIELD | Source: Ambulatory Visit | Attending: Gastroenterology | Admitting: Gastroenterology

## 2014-09-04 DIAGNOSIS — B1910 Unspecified viral hepatitis B without hepatic coma: Secondary | ICD-10-CM | POA: Diagnosis not present

## 2014-09-04 DIAGNOSIS — K7040 Alcoholic hepatic failure without coma: Secondary | ICD-10-CM | POA: Diagnosis not present

## 2016-06-15 IMAGING — CT CT MAXILLOFACIAL W/O CM
1 series · 16 of 30 positions shown, 20 images · non-contrast
Comparison: None.

CLINICAL DATA: Dog bite to the nose yesterday.

EXAM:
CT MAXILLOFACIAL WITHOUT CONTRAST
TECHNIQUE: Multidetector CT imaging of the maxillofacial structures was
performed. Multiplanar CT image reconstructions were also generated.
A small metallic BB was placed on the right temple in order to
reliably differentiate right from left.

[Series 3: facial st · axial · 0.29mm/px · z∈[-195,-57]mm · 16 of 75 slices shown, 20 images]
[im 3/75  brain]
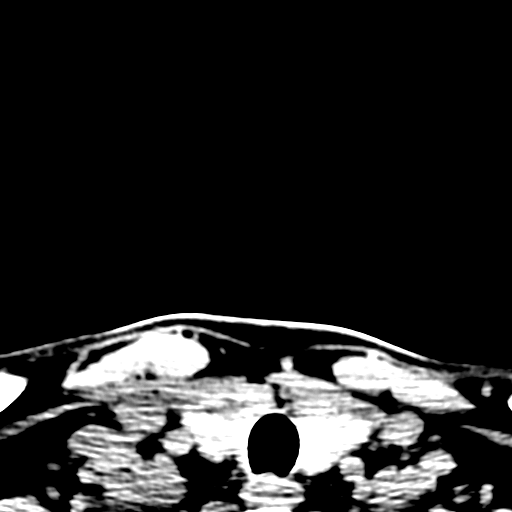
[im 3/75  bone]
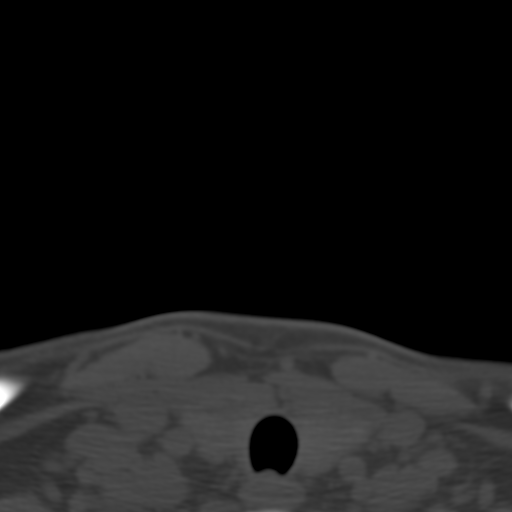
[im 8/75  bone]
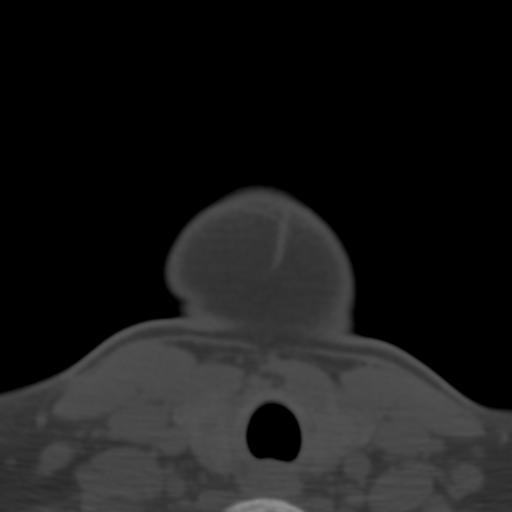
[im 13/75  bone]
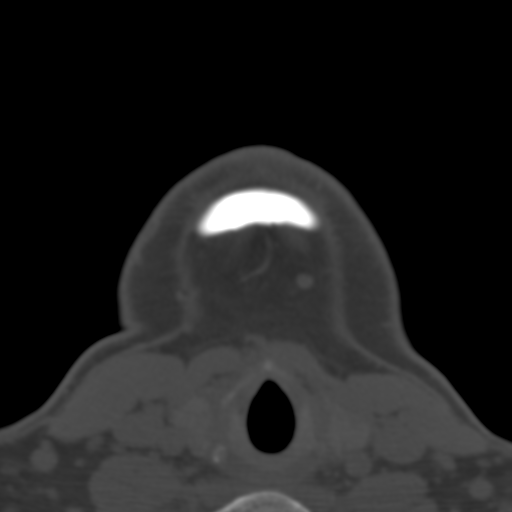
[im 18/75  bone]
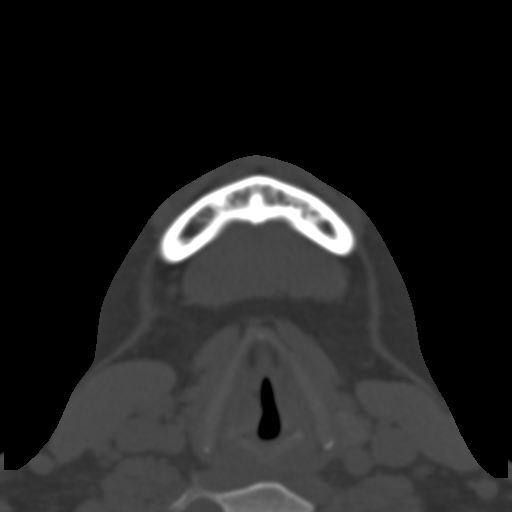
[im 21/75  brain]
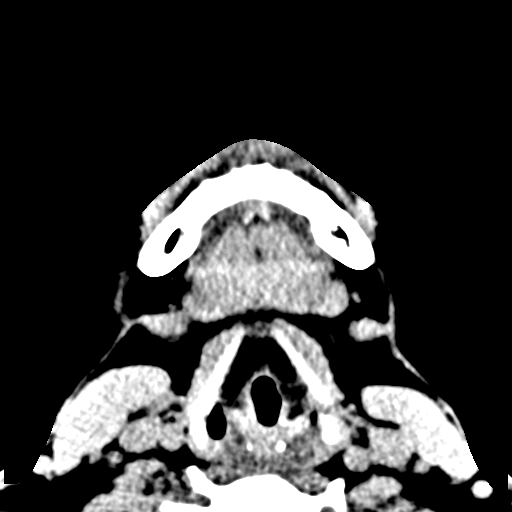
[im 21/75  bone]
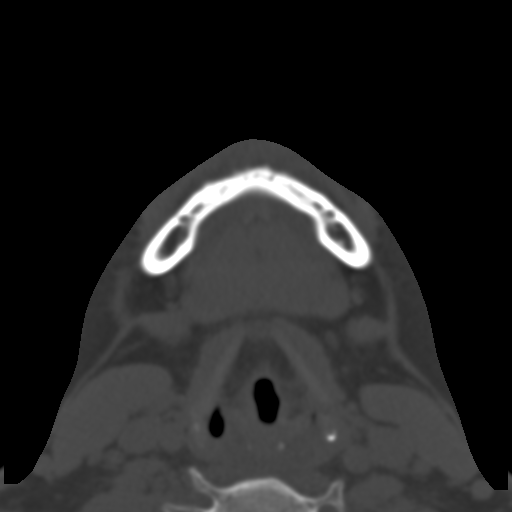
[im 26/75  bone]
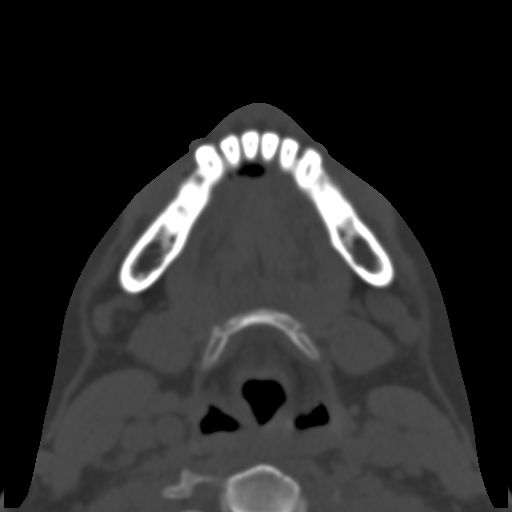
[im 31/75  bone]
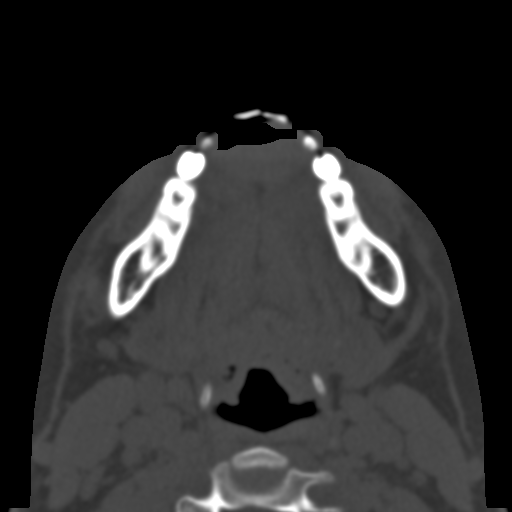
[im 36/75  bone]
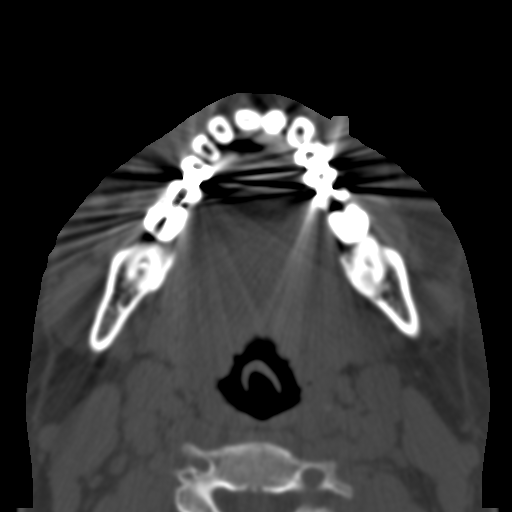
[im 39/75  brain]
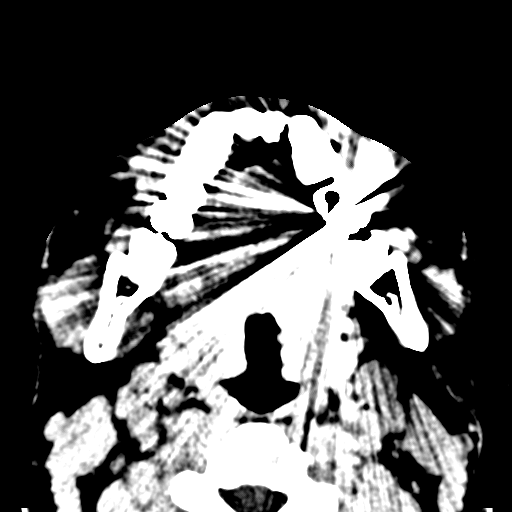
[im 39/75  bone]
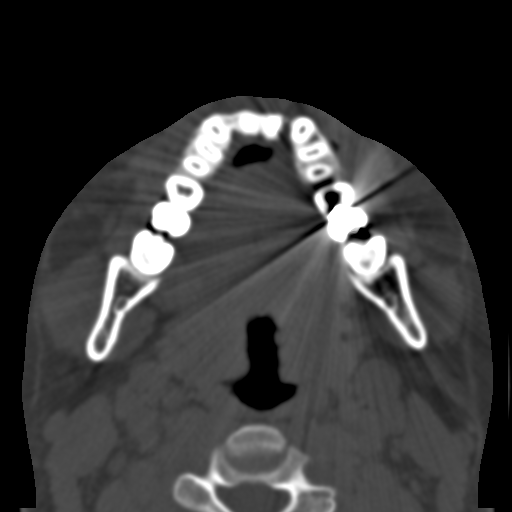
[im 44/75  bone]
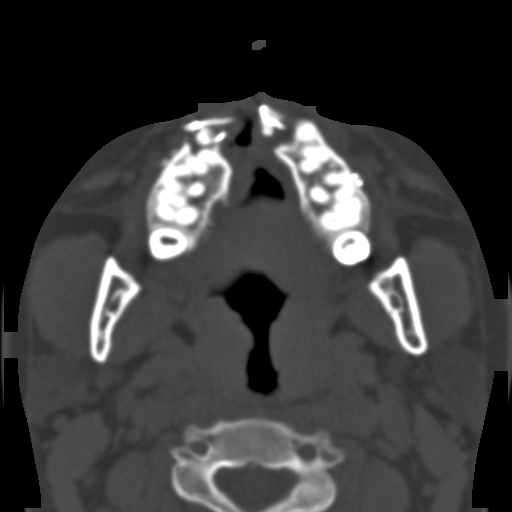
[im 49/75  bone]
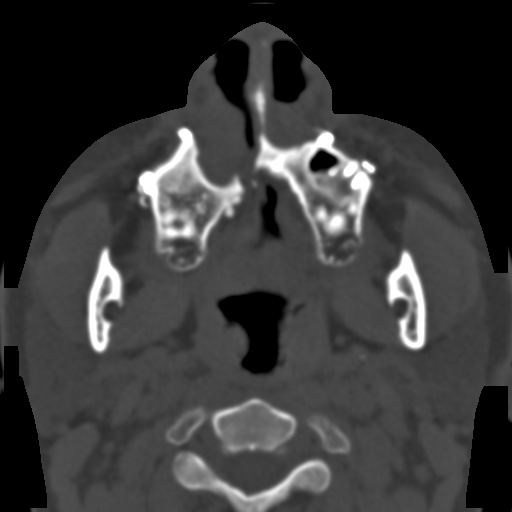
[im 54/75  bone]
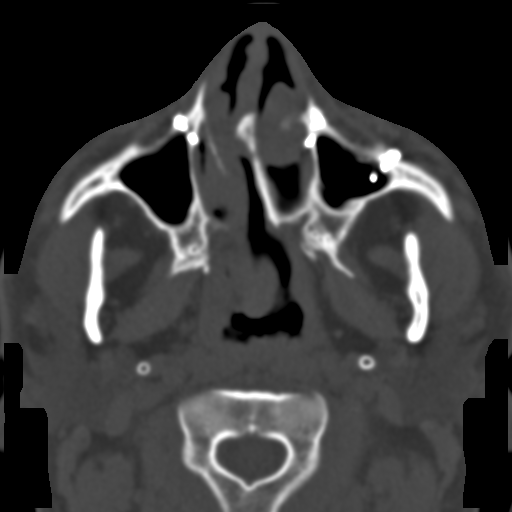
[im 57/75  brain]
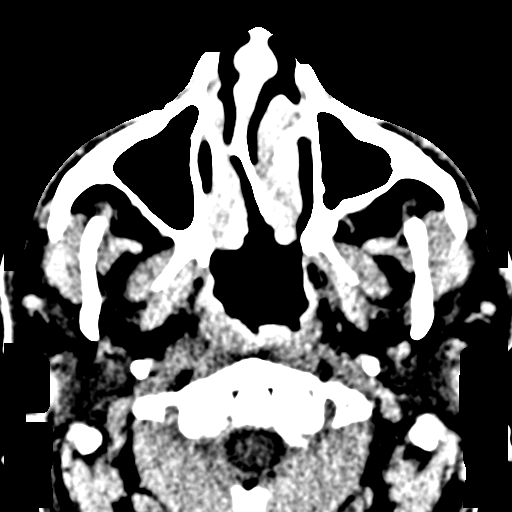
[im 57/75  bone]
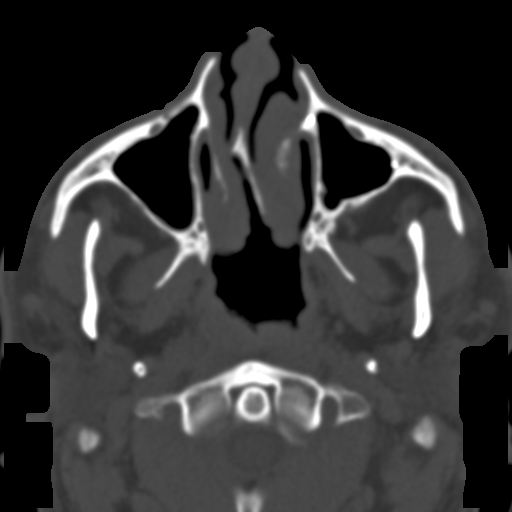
[im 62/75  bone]
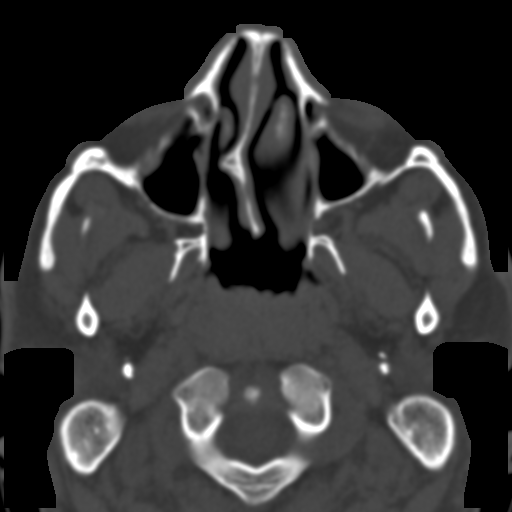
[im 67/75  bone]
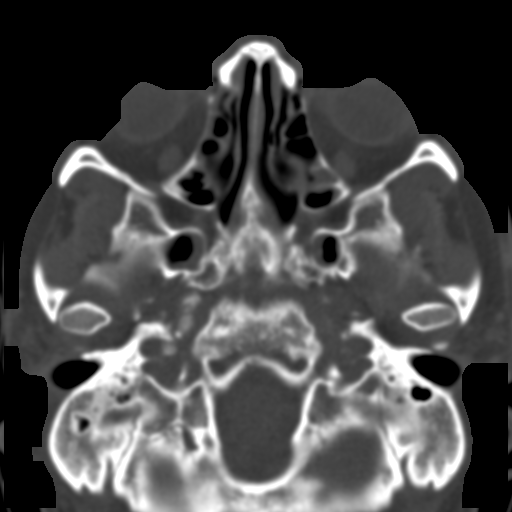
[im 72/75  bone]
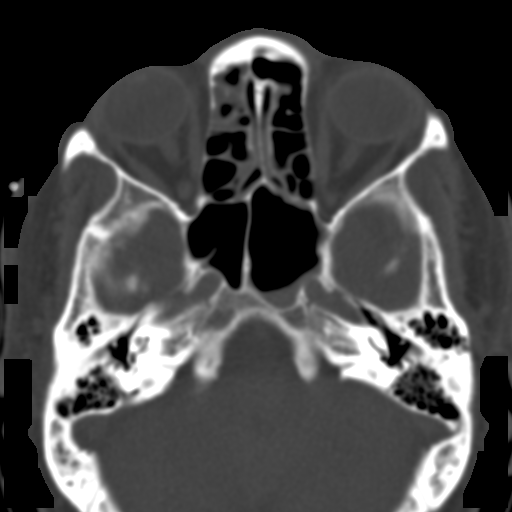

[16 of 30 positions shown; findings below may reference images not displayed]

FINDINGS: There is prior surgery in the anterior alveolar ridge and inferior
maxillary sinuses with multiple surgical wires and mini plates.
There is no acute fracture. No soft tissue foreign body is evident.
No soft tissue gas or fluid collection is evident. There is
rightward septal curvature and beaking. There is minimal membrane
thickening in the maxillary sinuses and in the left sphenoid sinus.
No paranasal sinus bladder or fluid levels are evident.
IMPRESSION: No evidence of significant acute traumatic injury.

## 2017-04-08 ENCOUNTER — Emergency Department (HOSPITAL_COMMUNITY)
Admission: EM | Admit: 2017-04-08 | Discharge: 2017-04-08 | Disposition: A | Discharge status: Other Acute Inpatient Hospital | Payer: 59 | Attending: Emergency Medicine | Admitting: Emergency Medicine

## 2017-04-08 ENCOUNTER — Encounter (HOSPITAL_COMMUNITY): Payer: Self-pay | Admitting: *Deleted

## 2017-04-08 ENCOUNTER — Encounter (HOSPITAL_COMMUNITY): Payer: Self-pay | Admitting: Emergency Medicine

## 2017-04-08 ENCOUNTER — Inpatient Hospital Stay (HOSPITAL_COMMUNITY)
Admission: RE | Admit: 2017-04-08 | Discharge: 2017-04-12 | DRG: 885 | Disposition: A | Discharge status: Home / Self Care | Payer: 59 | Attending: Psychiatry | Admitting: Psychiatry

## 2017-04-08 DIAGNOSIS — K7040 Alcoholic hepatic failure without coma: Secondary | ICD-10-CM | POA: Diagnosis present

## 2017-04-08 DIAGNOSIS — B1910 Unspecified viral hepatitis B without hepatic coma: Secondary | ICD-10-CM | POA: Diagnosis present

## 2017-04-08 DIAGNOSIS — F329 Major depressive disorder, single episode, unspecified: Secondary | ICD-10-CM | POA: Diagnosis present

## 2017-04-08 DIAGNOSIS — R4587 Impulsiveness: Secondary | ICD-10-CM | POA: Diagnosis not present

## 2017-04-08 DIAGNOSIS — R451 Restlessness and agitation: Secondary | ICD-10-CM | POA: Insufficient documentation

## 2017-04-08 DIAGNOSIS — F419 Anxiety disorder, unspecified: Secondary | ICD-10-CM | POA: Diagnosis not present

## 2017-04-08 DIAGNOSIS — R45851 Suicidal ideations: Secondary | ICD-10-CM | POA: Diagnosis present

## 2017-04-08 DIAGNOSIS — R454 Irritability and anger: Secondary | ICD-10-CM | POA: Diagnosis not present

## 2017-04-08 DIAGNOSIS — Z915 Personal history of self-harm: Secondary | ICD-10-CM | POA: Diagnosis not present

## 2017-04-08 DIAGNOSIS — R443 Hallucinations, unspecified: Secondary | ICD-10-CM | POA: Diagnosis not present

## 2017-04-08 DIAGNOSIS — Z046 Encounter for general psychiatric examination, requested by authority: Secondary | ICD-10-CM | POA: Insufficient documentation

## 2017-04-08 DIAGNOSIS — F17210 Nicotine dependence, cigarettes, uncomplicated: Secondary | ICD-10-CM | POA: Diagnosis present

## 2017-04-08 DIAGNOSIS — F332 Major depressive disorder, recurrent severe without psychotic features: Secondary | ICD-10-CM | POA: Diagnosis present

## 2017-04-08 DIAGNOSIS — G4700 Insomnia, unspecified: Secondary | ICD-10-CM | POA: Diagnosis present

## 2017-04-08 DIAGNOSIS — R450 Nervousness: Secondary | ICD-10-CM | POA: Diagnosis not present

## 2017-04-08 DIAGNOSIS — Z87891 Personal history of nicotine dependence: Secondary | ICD-10-CM | POA: Diagnosis not present

## 2017-04-08 DIAGNOSIS — H9190 Unspecified hearing loss, unspecified ear: Secondary | ICD-10-CM | POA: Diagnosis not present

## 2017-04-08 DIAGNOSIS — F39 Unspecified mood [affective] disorder: Secondary | ICD-10-CM | POA: Diagnosis not present

## 2017-04-08 LAB — COMPREHENSIVE METABOLIC PANEL
ALBUMIN: 4.7 g/dL (ref 3.5–5.0)
ALT: 27 U/L (ref 17–63)
ANION GAP: 9 (ref 5–15)
AST: 31 U/L (ref 15–41)
Alkaline Phosphatase: 67 U/L (ref 38–126)
BILIRUBIN TOTAL: 0.7 mg/dL (ref 0.3–1.2)
BUN: 17 mg/dL (ref 6–20)
CO2: 24 mmol/L (ref 22–32)
Calcium: 9.4 mg/dL (ref 8.9–10.3)
Chloride: 104 mmol/L (ref 101–111)
Creatinine, Ser: 0.69 mg/dL (ref 0.61–1.24)
GFR calc Af Amer: >60 mL/min (ref >60)
GFR calc non Af Amer: >60 mL/min (ref >60)
GLUCOSE: 120 mg/dL — AB (ref 65–99)
POTASSIUM: 3.9 mmol/L (ref 3.5–5.1)
Sodium: 137 mmol/L (ref 135–145)
TOTAL PROTEIN: 8.2 g/dL — AB (ref 6.5–8.1)

## 2017-04-08 LAB — CBC
HCT: 46.9 % (ref 39.0–52.0)
Hemoglobin: 16.9 g/dL (ref 13.0–17.0)
MCH: 30.6 pg (ref 26.0–34.0)
MCHC: 36 g/dL (ref 30.0–36.0)
MCV: 84.8 fL (ref 78.0–100.0)
Platelets: 199 10*3/uL (ref 150–400)
RBC: 5.53 MIL/uL (ref 4.22–5.81)
RDW: 13.1 % (ref 11.5–15.5)
WBC: 7.2 10*3/uL (ref 4.0–10.5)

## 2017-04-08 LAB — SALICYLATE LEVEL: Salicylate Lvl: <7 mg/dL (ref 2.8–30.0)

## 2017-04-08 LAB — RAPID URINE DRUG SCREEN, HOSP PERFORMED
AMPHETAMINES: NOT DETECTED
BARBITURATES: NOT DETECTED
BENZODIAZEPINES: NOT DETECTED
COCAINE: NOT DETECTED
Opiates: NOT DETECTED
Tetrahydrocannabinol: NOT DETECTED

## 2017-04-08 LAB — ACETAMINOPHEN LEVEL

## 2017-04-08 LAB — ETHANOL: Alcohol, Ethyl (B): <10 mg/dL (ref <10)

## 2017-04-08 MED ORDER — MAGNESIUM HYDROXIDE 400 MG/5ML PO SUSP: 30.0000 mL | Freq: Every day | ORAL | Status: DC | PRN

## 2017-04-08 MED ORDER — ALUM & MAG HYDROXIDE-SIMETH 200-200-20 MG/5ML PO SUSP: 30.0000 mL | ORAL | Status: DC | PRN

## 2017-04-08 MED ORDER — ACETAMINOPHEN 325 MG PO TABS: 650.0000 mg | ORAL_TABLET | Freq: Four times a day (QID) | ORAL | Status: DC | PRN

## 2017-04-08 NOTE — Progress Notes (Signed)
Nursing Progress Note [id number]  D) Patient presents calm, cooperative and pleasant. Patient is observed up in the dayroom with peers. 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 Patient has no issues communicating. 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 Patient reports he is "doing better today than yesterday". Patient denies SI/HI/AVH or pain. Patient contracts for safety on the unit. Patient reports sleeping well without medication.  A) Emotional support given. 1:1 interaction and active listening provided. No medications requested/scheduled/administered this shift. Medications and plan of care reviewed with patient. Patient verbalized understanding without further questions. Snacks and fluids provided. Opportunities for questions or concerns presented to patient. Patient encouraged to continue to work on treatment goals. Labs, vital signs and patient behavior monitored throughout shift. Patient safety maintained with q15 min safety checks. Low fall risk precautions in place and reviewed with patient; patient verbalized understanding.  R) Patient receptive to interaction with nurse. Patient remains safe on the unit at this time. Will continue to monitor.

## 2017-04-08 NOTE — ED Notes (Signed)
Bed: [facility]4 Expected date:  Expected time:  Means of arrival:  Comments: 

## 2017-04-08 NOTE — Tx Team (Signed)
Initial Treatment Plan 04/08/2017 3:05 PM [name] [name]    PATIENT STRESSORS: Loss of romantic relationship Marital or family conflict Occupational concerns   PATIENT STRENGTHS: Ability for insight Average or above average intelligence Capable of independent living General fund of knowledge Motivation for treatment/growth Supportive family/friends   PATIENT IDENTIFIED PROBLEMS: Depression Suicidal thoughts "To be happier and not leave here and feel suicidal"                     DISCHARGE CRITERIA:  Ability to meet basic life and health needs Improved stabilization in mood, thinking, and/or behavior Verbal commitment to aftercare and medication compliance  PRELIMINARY DISCHARGE PLAN: Attend aftercare/continuing care group Return to previous living arrangement  PATIENT/FAMILY INVOLVEMENT: This treatment plan has been presented to and reviewed with the patient, [name], and/or family member, .  The patient and family have been given the opportunity to ask questions and make suggestions.  Domonique Brouillard, [place], [place] 04/08/2017, 3:05 PM

## 2017-04-08 NOTE — BH Assessment (Signed)
[facility] Assessment Progress Note  Per Mojeed Akintayo, MD, this pt requires psychiatric hospitalization at this time.  Berneice Heinrich, RN, [facility] Rm [id number]; they will be ready to receive pt at 13:00.  Pt has signed Voluntary Admission and Consent for Treatment, as well as Consent to Release Information to Maryln Manuel, PhD, his outpatient provider, and a notification call has been placed.  Signed forms have been faxed to [facility].  Pt's nurse, Diane, has been notified, and agrees to send original paperwork along with pt via Juel Burrow, and to call report to [phone].  Doylene Canning, MA Triage Specialist [phone]

## 2017-04-08 NOTE — H&P (Signed)
Behavioral Health Medical Screening Exam  [name] is a 26 y.o. male who presents to [facility]. Patient has a history of suicide attempt, depression, and anxiety. Reports that he was attempting to hang himself, but his friend stopped him. Denies HI, and AVH. Does not appear to be responding to internal stimuli. Reports occasional marijuana use, and alcohol use 2-3 times a week. Denies abuse of other substances.   Total Time spent with patient: 15 minutes  Psychiatric Specialty Exam: Physical Exam  Constitutional: He is oriented to person, place, and time. He appears well-developed and well-nourished. No distress.  HENT:  Head: Normocephalic and atraumatic.  Right Ear: External ear normal.  Eyes: Conjunctivae are normal. Right eye exhibits no discharge. Left eye exhibits no discharge.  Cardiovascular: Normal rate, regular rhythm and normal heart sounds.   Respiratory: Effort normal and breath sounds normal. No respiratory distress.  Musculoskeletal: Normal range of motion.  Neurological: He is alert and oriented to person, place, and time.  Skin: Skin is warm and dry. He is not diaphoretic.  Psychiatric: His speech is normal and behavior is normal. His mood appears anxious. Thought content is not paranoid. Cognition and memory are normal. He expresses impulsivity and inappropriate judgment. He exhibits a depressed mood. He expresses suicidal ideation. He expresses no homicidal ideation. He expresses suicidal plans.    Review of Systems  Psychiatric/Behavioral: Positive for depression, substance abuse and suicidal ideas. Negative for hallucinations and memory loss. The patient is nervous/anxious and has insomnia.     Blood pressure 122/73, pulse 68, temperature 97.7 F (36.5 C), temperature source Oral, resp. rate 16, SpO2 100 %.There is no height or weight on file to calculate BMI.  General Appearance: Casual and Fairly Groomed  Eye Contact:  Good  Speech:  Clear and Coherent and Normal  Rate  Volume:  Normal  Mood:  Anxious, Depressed, Dysphoric, Hopeless and Worthless  Affect:  Congruent and Depressed  Thought Process:  Coherent  Orientation:  Full (Time, Place, and Person)  Thought Content:  Logical and Hallucinations: None  Suicidal Thoughts:  Yes.  with intent/plan  Homicidal Thoughts:  No  Memory:  Immediate;   Good Recent;   Good  Judgement:  Impaired  Insight:  Fair  Psychomotor Activity:  Normal  Concentration: Concentration: Good and Attention Span: Good  Recall:  Good  Fund of Knowledge:Good  Language: Good  Akathisia:  NA  Handed:  Right  AIMS (if indicated):     Assets:  Communication Skills Desire for Improvement Financial Resources/Insurance Housing Leisure Time Physical Health  Sleep:       Musculoskeletal: Strength & Muscle Tone: within normal limits Gait & Station: normal Patient leans:  Blood pressure 122/73, pulse 68, temperature 97.7 F (36.5 C), temperature source Oral, resp. rate 16, SpO2 100 %.  Recommendations:  Based on my evaluation the patient does not appear to have an emergency medical condition.  Jackelyn Poling, NP 04/08/2017, 6:48 AM

## 2017-04-08 NOTE — BH Assessment (Signed)
Tele Assessment Note   Patient Name: [name] MRN: [id number] Referring Physician: Brent [facility]- WALK-IN Location of Patient: BH-ASSESSMENT SERVICE Location of Provider: Behavioral Health TTS Department  JONANTHONY NAHAR is an 26 y.o. male who was brought voluntarily to [facility] this morning as a Walk-In. Pt c/o SI with a plan and gestures of hanging himself. Pt sts he went to [facility] to buy rope to hang himself but stopped himself and called a friend instead.  This was the friend who brought him to [facility]. Pt denies HI, SHI and AVH. Pt has a hx of superficially cutting himself but sts he has not cut himself since "years ago." Pt sts he started cutting at approximately 26 yo. Pt sts he attempted to kill himself once about 10 years ago and was psychiatrically hospitalized following the attempt. Pt sts he was psychiatrically admitted to [facility] in 2006 and 2012. Pt sts he has had several recent stressors including the break-up of a romantic relationship, work stress and family stresses. Pt sts "I think it just hit me today" referring to the romantic break-up. Pt sts that at times, he has anger outbursts and may slam objects on a table but he sts he has never hurt anyone. Pt is not prescribed any psychiatric medications currently but sees Dr. Maryln Manuel. Pt has been seeing this therapist for many years.   Pt sts he lives in an apartment alone and works full-time as a Investment banker, operational. 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 Pt has been diagnosed with several chronic health conditions including Hepatitis B, Irregular Heartbeat and in 2016, Alcoholic Liver Failure. Pt sts he owns a gun but has had a friend secure it away from him for now. Pt denies any legal hx. Pt sts his mother, father and brother have been diagnosed with depression. Pt sts he sleeps about 5-6 hours per night. Pt sts he has had decreased appetite over the last month and thinks he may have lost as much as 10 pounds. Pt  denies any hx of abuse. {t's symptoms of depression including sadness, fatigue, decreased self esteem, tearfulness / crying spells, self isolation, lack of motivation for activities and pleasure, irritability, negative outlook, difficulty thinking & concentrating, feeling helpless and hopeless, sleep and eating disturbances. Pt sts he has symptoms of anxiety including intrusive thoughts, excessive worrying and restlessness. Pt sts he drinks alcohol about 5 times per week (1-3 beers each time), smokes cannabis about once each month "socially" and smokes 1/2 - 1 pack of cigarettes daily. Pt sts he has not been intoxicated from alcohol in "a few months."   Pt was dressed in appropriate, modest street clothes. Pt was calm, alert, cooperative and polite. Pt kept good eye contact, spoke in a somewhat slurred tone and at a normal pace. 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 Pt moved in a normal manner when moving. Pt's thought process was coherent and relevant and judgement was impaired.  No indication of delusional thinking or response to internal stimuli. Pt's mood was stated as depressed and nxious and his blunted affect was congruent.  Pt was oriented x 4, to person, place, time and situation.   Diagnosis: MDD, Recurrent, Severe without psychotic features; GAD by hx  Past Medical History:  Past Medical History:  Diagnosis Date  . Depression   . Hepatitis B     No past surgical history on file.  Family History:  Family History  Problem Relation Age of Onset  . Hyperlipidemia Mother     Social History:  reports that he quit smoking about 4 years ago. He does not have any smokeless tobacco history on file. He reports that he does not drink alcohol or use drugs.  Additional Social History:  Alcohol / Drug Use Prescriptions: NO PSYCH MEDS History of alcohol / drug use?: Yes Longest period of sobriety (when/how long): UNKNOWN Substance #1 Name of Substance 1:  ALCOHOL 1 - Age of First Use: 13 1 - Amount (size/oz): 1-3 BEERS  1 - Frequency: 5 X WEEK 1 - Duration: ONGOING 1 - Last Use / Amount: 04/07/17 Substance #2 Name of Substance 2: CANNABIS 2 - Age of First Use: 15 2 - Amount (size/oz): "PASSING JOINT AMONG FRIENDS SOCIALLY" 2 - Frequency: 1 X MONTH 2 - Duration: ONGOING 2 - Last Use / Amount: A FEW MONTHS AGO Substance #3 Name of Substance 3: NICOTINE/CIGARETTES 3 - Age of First Use: 14 3 - Amount (size/oz): 1/2 - 1 PACK 3 - Frequency: DAILY 3 - Duration: ONGOING 3 - Last Use / Amount: 04/07/17  CIWA:   COWS:    PATIENT STRENGTHS: (choose at least two) Average or above average intelligence Capable of independent living Supportive family/friends  Allergies: No Known Allergies  Home Medications:  (Not in a hospital admission)  OB/GYN Status:  No LMP for male patient.  General Assessment Data Location of Assessment: [facility] Assessment Services TTS Assessment: In system Is this a Tele or Face-to-Face Assessment?: Face-to-Face Is this an Initial Assessment or a Re-assessment for this encounter?: Initial Assessment Marital status: Single Is patient pregnant?: No Pregnancy Status: No Living Arrangements: Alone Can pt return to current living arrangement?: Yes Admission Status: Voluntary Is patient capable of signing voluntary admission?: Yes Referral Source: Self/Family/Friend Insurance type:  Herbalist)  Medical Screening Exam [facility] Walk-in ONLY) Medical Exam completed: Yes (MSE BY Nira Conn, NP)  Crisis Care Plan Living Arrangements: Alone Name of Psychiatrist:  (NONE) Name of Therapist:  (DR Jone Baseman)  Education Status Is patient currently in school?: No Highest grade of school patient has completed:  (HS)  Risk to self with the past 6 months Suicidal Ideation: Yes-Currently Present Has patient been a risk to self within the past 6 months prior to admission? : No Suicidal Intent: Yes-Currently  Present Has patient had any suicidal intent within the past 6 months prior to admission? : No Is patient at risk for suicide?: Yes Suicidal Plan?: Yes-Currently Present Has patient had any suicidal plan within the past 6 months prior to admission? : No Specify Current Suicidal Plan:  (PLAN TO HANG HIMSELF) Access to Means: No (DENIES ACCESS TO GUNS) What has been your use of drugs/alcohol within the last 12 months?:  (REGULAR USE) Previous Attempts/Gestures: Yes How many times?:  (1 "ABOUT 10 YRS AGO") Other Self Harm Risks:  (HX OF CUTTING) Triggers for Past Attempts: None known Intentional Self Injurious Behavior: Cutting Comment - Self Injurious Behavior:  (HX OF SUPERFICIAL CUTTING FROM 26 YO TO 19 YO APPROX. ) Family Suicide History: Unknown Recent stressful life event(s): Loss (Comment) (RELATIONSHIP BREAKUP; WORK STRESS) Persecutory voices/beliefs?: No Depression: Yes Depression Symptoms: Isolating, Fatigue, Guilt, Loss of interest in usual pleasures, Feeling worthless/self pity, Feeling angry/irritable Substance abuse history and/or treatment for substance abuse?: Yes Suicide prevention information given to non-admitted patients: Not applicable  Risk to Others within the past 6 months Homicidal Ideation: No Does patient have any lifetime risk of violence toward  others beyond the six months prior to admission? : No Thoughts of Harm to Others: No Current Homicidal Intent: No Current Homicidal Plan: No Access to Homicidal Means: No Identified Victim:  (NONE) History of harm to others?: No Assessment of Violence: None Noted Violent Behavior Description:  (NA) Does patient have access to weapons?: No Criminal Charges Pending?: No Does patient have a court date: No Is patient on probation?: No  Psychosis Hallucinations: None noted Delusions: None noted  Mental Status Report Appearance/Hygiene: Disheveled, Unremarkable Eye Contact: Good Motor Activity: Freedom of  movement Speech: Logical/coherent, Slurred (SPEECH IMPEDIMENT) Level of Consciousness: Alert Mood: Depressed, Anxious Affect: Anxious, Blunted, Depressed Anxiety Level: Minimal Thought Processes: Coherent, Relevant Judgement: Impaired Orientation: Person, Place, Time, Situation Obsessive Compulsive Thoughts/Behaviors: None  Cognitive Functioning Concentration: Decreased Memory: Recent Intact, Remote Intact IQ: Average Insight: see judgement above Impulse Control: Fair Appetite: Good Weight Loss:  (10 LBS IN ABOUT 1 MONTH) Weight Gain:  (0) Sleep: Decreased Total Hours of Sleep:  (5-6) Vegetative Symptoms: None  ADLScreening [facility] Assessment Services) Patient's cognitive ability adequate to safely complete daily activities?: Yes Patient able to express need for assistance with ADLs?: Yes Independently performs ADLs?: Yes (appropriate for developmental age)  Prior Inpatient Therapy Prior Inpatient Therapy: Yes Prior Therapy Dates:  (2006, 2012) Prior Therapy Facilty/Provider(s):  [facility]) Reason for Treatment:  (MDD, SI)  Prior Outpatient Therapy Prior Outpatient Therapy: Yes Prior Therapy Dates:  (CURRENT) Prior Therapy Facilty/Provider(s):  (DR Maryln Manuel) Reason for Treatment:  (MDD, GAD) Does patient have an ACCT team?: No Does patient have Intensive In-House Services?  : No Does patient have [facility] services? : No Does patient have P4CC services?: No  ADL Screening (condition at time of admission) Patient's cognitive ability adequate to safely complete daily activities?: Yes Patient able to express need for assistance with ADLs?: Yes Independently performs ADLs?: Yes (appropriate for developmental age)       Abuse/Neglect Assessment (Assessment to be complete while patient is alone) Physical Abuse: Denies Verbal Abuse: Denies Sexual Abuse: Denies Exploitation of patient/patient's resources: Denies Self-Neglect: Denies     Merchant navy officer (For  Healthcare) Does Patient Have a Medical Advance Directive?: No Would patient like information on creating a medical advance directive?: No - Patient declined    Additional Information 1:1 In Past 12 Months?: No CIRT Risk: No Elopement Risk: No Does patient have medical clearance?: No     Disposition:  Disposition Initial Assessment Completed for this Encounter: Yes Disposition of Patient: Inpatient treatment program (PER JASON BERRY, NP) Type of inpatient treatment program: Adult (ACCEPTED TO [facility] AFTER DISCHARGES 04/08/17)  This service was provided via telemedicine using a 2-way, interactive audio and Immunologist.  Names of all persons participating in this telemedicine service and their role in this encounter. Name: Dwyane Dee Role: Patient  Name:  Role:   Name:  Role:   Name:  Role:    Consulted with Nira Conn, NP. Recommend Inpatient treatment. Accepted to [facility] after discharges on 04/08/17. Arrival to be coordinated with Daytime AC.   Sent to [facility] for Medical Clearance  Beryle Flock, MS, [facility], [facility] Triage Specialist [facility] 04/08/2017 6:36 AM

## 2017-04-08 NOTE — ED Notes (Signed)
Pt transported to [facility] by [facility]. All belongings returned to pt who signed for same. Pt was calm and cooperative.  

## 2017-04-08 NOTE — Progress Notes (Signed)
[name] is a 26 year old male pt admitted on voluntary basis. On admission, he reports that he has been feeling depressed and suicidal and cites work stress as well as loss of relationship. [name] denies any SI on admission and is able to contract for safety while in the hospital. He reports that he has been hospitalized in the past but reports it has been about 10 years since his last hospitalization. He reports that he does have a PCP but reports that he is not currently on medications. He reports some current alcohol usage but denies any substance abuse issues. He reports that he lives alone and will go back there after discharge. [name] was oriented to the unit and safety maintained.

## 2017-04-08 NOTE — ED Notes (Signed)
Introduced self to patient. Pt oriented to unit expectations.  Assessed pt for:  A) Anxiety &/or agitation: On admission to the [facility] Pt is calm and cooperative with a flat affect and depressed mood. He said that he has been feeling suicidal with a plan and does not really know why. He gets outpt therapy and does not know if he needs to go inpatient or not, but is willing to do what he needs.   S) Safety: Safety maintained with q-15-minute checks and hourly rounds by staff.  A) ADLs: Pt able to perform ADLs independently.  P) Pick-Up (room cleanliness): Pt's room clean and free of clutter.

## 2017-04-08 NOTE — ED Triage Notes (Signed)
Patient complains of thoughts of suicide. Patient states that his plan to hurt himself was to hang himself. Patient states that he has a lot going on to make him feel this way.

## 2017-04-08 NOTE — ED Provider Notes (Signed)
[facility]-EMERGENCY DEPT Provider Note   CSN: [id number] Arrival date & time: 04/08/17  0865     History   Chief Complaint Chief Complaint  Patient presents with  . Suicidal    HPI [name] is a 26 y.o. male.  He complains of suicidal ideation, with plan to hang self, which evolved yesterday, but he feels like currently he is not planning to kill himself.  He came here with a friend for evaluation.  He is actively seeing a therapist but not currently taking psychiatric medications.  He denies recent illnesses.  He denies use of alcohol or illegal drugs.  He is currently employed, as a Investment banker, operational.  There are no other known modifying factors.  HPI  Past Medical History:  Diagnosis Date  . Depression   . Hepatitis B     There are no active problems to display for this patient.   History reviewed. No pertinent surgical history.     Home Medications    Prior to Admission medications   Medication Sig Start Date End Date Taking? Authorizing Provider  amoxicillin-clavulanate (AUGMENTIN) 875-125 MG per tablet Take 1 tablet by mouth 2 (two) times daily. One po bid x 7 days 08/03/14   Sciacca, Marissa, PA-C  loratadine (CLARITIN) 10 MG tablet Take 10 mg by mouth daily as needed for allergies.     [provider]    Family History Family History  Problem Relation Age of Onset  . Hyperlipidemia Mother     Social History Social History  Substance Use Topics  . Smoking status: Former Smoker    Quit date: 04/19/2012  . Smokeless tobacco: Never Used  . Alcohol use No     Allergies   Patient has no known allergies.   Review of Systems Review of Systems  All other systems reviewed and are negative.    Physical Exam Updated Vital Signs BP 111/83 (BP Location: Left Arm)   Pulse 71   Temp 98.1 F (36.7 C) (Oral)   Resp 18   Ht  (1.549 m)   Wt 68 kg (150 lb)   SpO2 93%   BMI 28.34 kg/m   Physical Exam  Constitutional: He is oriented to person,  place, and time. He appears well-developed and well-nourished. No distress.  HENT:  Head: Normocephalic and atraumatic.  Right Ear: External ear normal.  Left Ear: External ear normal.  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  Eyes: Pupils are equal, round, and reactive to light. Conjunctivae and EOM are normal.  Neck: Normal range of motion and phonation normal. Neck supple.  Cardiovascular: Normal rate, regular rhythm and normal heart sounds.   Pulmonary/Chest: Effort normal and breath sounds normal. He exhibits no bony tenderness.  Abdominal: Soft. There is no tenderness.  Musculoskeletal: Normal range of motion.  Neurological: He is alert and oriented to person, place, and time. No cranial nerve deficit or sensory deficit. He exhibits normal muscle tone. Coordination normal.  Skin: Skin is warm, dry and intact.  Psychiatric: He has a normal mood and affect. His behavior is normal. Judgment and thought content normal.  Nursing note and vitals reviewed.    ED Treatments / Results  Labs (all labs ordered are listed, but only abnormal results are displayed) Labs Reviewed  COMPREHENSIVE METABOLIC PANEL  ETHANOL  SALICYLATE LEVEL  ACETAMINOPHEN LEVEL  CBC  RAPID URINE DRUG SCREEN, HOSP PERFORMED    EKG  EKG Interpretation None       Radiology  No results found.  Procedures Procedures (including critical care time)  Medications Ordered in ED Medications - No data to display   Initial Impression / Assessment and Plan / ED Course  I have reviewed the triage vital signs and the nursing notes.  Pertinent labs & imaging results that were available during my care of the patient were reviewed by me and considered in my medical decision making (see chart for details).  Clinical Course as of Apr 11 1104  Fri Apr 08, 2017  1124 At this point the patient is medically cleared for treatment by psychiatry.  [EW]    Clinical Course User  Index [EW] Mancel Bale, MD     Patient Vitals for the past 24 hrs:  BP Temp Temp src Pulse Resp SpO2 Height Weight  04/08/17 0651 111/83 98.1 F (36.7 C) Oral 71 18 93 %  (1.549 m) 68 kg (150 lb)    TTS consult   Final Clinical Impressions(s) / ED Diagnoses   Final diagnoses:  Major depressive disorder, recurrent severe without psychotic features (HCC)    Depression with suicidal ideation  Nursing Notes Reviewed/ Care Coordinated Applicable Imaging Reviewed Interpretation of Laboratory Data incorporated into ED treatment  Plan-as per TTS in conjunction with oncoming provider team  New Prescriptions New Prescriptions   No medications on file     Mancel Bale, MD 04/11/17 1106

## 2017-04-08 NOTE — Progress Notes (Signed)
Report received from admitting nurse. Patient denies SI/HI/AVH and pain at this time. Orders received and acknowledged.  Medications administered as per order. Patient verbally contracts for safety on the unit and agrees to come to staff before acting on any self harm thoughts/feelings and other needs or concerns. 15 min checks initiated for safety and patient oriented to the unit and the unit schedule. 

## 2017-04-08 NOTE — Plan of Care (Signed)
Problem: Education: Goal: Emotional status will improve Outcome: Progressing Patient reports "I am doing better today than yesterday".  Problem: Safety: Goal: Periods of time without injury will increase Outcome: Progressing Patient is on q15 minute safety checks and low fall risk precautions. Patient contracts for safety on the unit and remains safe at this time.

## 2017-04-09 DIAGNOSIS — R4587 Impulsiveness: Secondary | ICD-10-CM

## 2017-04-09 DIAGNOSIS — R454 Irritability and anger: Secondary | ICD-10-CM

## 2017-04-09 DIAGNOSIS — F332 Major depressive disorder, recurrent severe without psychotic features: Principal | ICD-10-CM

## 2017-04-09 DIAGNOSIS — R45851 Suicidal ideations: Secondary | ICD-10-CM

## 2017-04-09 DIAGNOSIS — B1910 Unspecified viral hepatitis B without hepatic coma: Secondary | ICD-10-CM

## 2017-04-09 DIAGNOSIS — F39 Unspecified mood [affective] disorder: Secondary | ICD-10-CM

## 2017-04-09 DIAGNOSIS — F17210 Nicotine dependence, cigarettes, uncomplicated: Secondary | ICD-10-CM

## 2017-04-09 DIAGNOSIS — G4700 Insomnia, unspecified: Secondary | ICD-10-CM

## 2017-04-09 DIAGNOSIS — R443 Hallucinations, unspecified: Secondary | ICD-10-CM

## 2017-04-09 MED ORDER — FLUOXETINE HCL 10 MG PO CAPS
10.0000 mg | ORAL_CAPSULE | Freq: Every day | ORAL | Status: DC
  Filled 2017-04-09 (×2): qty 1

## 2017-04-09 MED ORDER — FLUOXETINE HCL 20 MG/5ML PO SOLN
20.0000 mg | Freq: Every day | ORAL | Status: DC
  Administered 2017-04-10: 20 mg via ORAL
  Filled 2017-04-09 (×2): qty 5

## 2017-04-09 MED ORDER — NICOTINE 21 MG/24HR TD PT24
21.0000 mg | MEDICATED_PATCH | Freq: Every day | TRANSDERMAL | Status: DC
  Administered 2017-04-09: 21 mg via TRANSDERMAL
  Filled 2017-04-09 (×4): qty 1

## 2017-04-09 MED ORDER — HYDROXYZINE HCL 25 MG PO TABS: 25.0000 mg | ORAL_TABLET | Freq: Three times a day (TID) | ORAL | Status: DC | PRN

## 2017-04-09 MED ORDER — NICOTINE POLACRILEX 2 MG MT GUM
CHEWING_GUM | OROMUCOSAL | Status: AC
  Filled 2017-04-09: qty 1

## 2017-04-09 MED ORDER — FLUOXETINE HCL 20 MG/5ML PO SOLN
10.0000 mg | Freq: Every day | ORAL | Status: DC
  Filled 2017-04-09 (×3): qty 5

## 2017-04-09 MED ORDER — TRAZODONE HCL 50 MG PO TABS
50.0000 mg | ORAL_TABLET | Freq: Every evening | ORAL | Status: DC | PRN
  Filled 2017-04-09 (×10): qty 1

## 2017-04-09 NOTE — [facility] Group Notes (Signed)
LCSW Group Therapy Note  Date/Time:  04/09/2017   10:00AM-11:00AM  Type of Therapy and Topic:  Group Therapy:  Fears and Unhealthy/Healthy Coping Skills  Participation Level:  Active   Description of Group:  The focus of this group was to discuss some of the prevalent fears that patients experience, and to identify the commonalities among group members.  An exercise was used to initiate the discussion, followed by writing on the white board a group-generated list of unhealthy coping and healthy coping techniques to deal with each fear.    Therapeutic Goals: 1. Patient will be able to distinguish between healthy and unhealthy coping skills 2. Patient will identify and describe 3 fears they experience 3. Patient will identify one positive coping strategy for each fear they experience 4. Patient will respond empathetically to peers' statements regarding fears they experience  Summary of Patient Progress:  The patient expressed a fear being alone and lonely.  He would like to add the healthy coping skill of attending support groups.  Therapeutic Modalities Cognitive Behavioral Therapy Motivational Interviewing  Barry Mantle, LCSW

## 2017-04-09 NOTE — H&P (Signed)
Psychiatric Admission Assessment Adult  Patient Identification: [name] MRN:  [id number] Date of Evaluation:  04/09/2017 Chief Complaint:  mdd severe without psych features Principal Diagnosis: Major depressive disorder, recurrent severe without psychotic features ([place]) Diagnosis:   Patient Active Problem List   Diagnosis Date Noted  . Major depressive disorder, recurrent severe without psychotic features ([place]) [F33.2] 04/08/2017   History of Present Illness: Per Admission assessment- [name] is an 26 y.o. male who was brought voluntarily to [facility] this morning as a Walk-In. Pt c/o SI with a plan and gestures of hanging himself. Pt sts he went to [facility] to buy rope to hang himself but stopped himself and called a friend instead.  This was the friend who brought him to [facility]. Pt denies HI, SHI and AVH. Pt has a hx of superficially cutting himself but sts he has not cut himself since "years ago." Pt sts he started cutting at approximately 26 yo. Pt sts he attempted to kill himself once about 10 years ago and was psychiatrically hospitalized following the attempt. Pt sts he was psychiatrically admitted to [facility] in 2006 and 2012. Pt sts he has had several recent stressors including the break-up of a romantic relationship, work stress and family stresses. Pt sts "I think it just hit me today" referring to the romantic break-up. Pt sts that at times, he has anger outbursts and may slam objects on a table but he sts he has never hurt anyone. Pt is not prescribed any psychiatric medications currently but sees Dr. Margaretmary Lombard. Pt has been seeing this therapist for many years.   Pt sts he lives in an apartment alone and works full-time as a Biomedical scientist. Pt sts he graduated from high school but did not [employer] school. Pt has been diagnosed with several chronic health conditions including Hepatitis B, Irregular Heartbeat and in 1829, Alcoholic Liver Failure. Pt sts he owns a gun but has  had a friend secure it away from him for now. Pt denies any legal hx. Pt sts his mother, father and brother have been diagnosed with depression. Pt sts he sleeps about 5-6 hours per night. Pt sts he has had decreased appetite over the last month and thinks he may have lost as much as 10 pounds. Pt denies any hx of abuse. {t's symptoms of depression including sadness, fatigue, decreased self esteem, tearfulness / crying spells, self isolation, lack of motivation for activities and pleasure, irritability, negative outlook, difficulty thinking & concentrating, feeling helpless and hopeless, sleep and eating disturbances. Pt sts he has symptoms of anxiety including intrusive thoughts, excessive worrying and restlessness. Pt sts he drinks alcohol about 5 times per week (1-3 beers each time), smokes cannabis about once each month "socially" and smokes 1/2 - 1 pack of cigarettes daily. Pt sts he has not been intoxicated from alcohol in "a few months."     On evaluation: [name] is awake, alert and oriented. Patient validates the information that was provided in the HPI. States he is feeling better today. Reports his is interested in restarting medications (prozac).  Denies suicidal or homicidal ideation during this assessment . Denies auditory or visual hallucination and does not appear to be responding to internal stimuli. Reports his depression 8/10 today. Reports attending group session is helping with his symptoms. Support, encouragement and reassurance was provided.   Associated Signs/Symptoms: Depression Symptoms:  depressed mood, suicidal thoughts with specific plan, (Hypo) Manic Symptoms:  Impulsivity, Irritable Mood, Anxiety Symptoms:  Excessive  Worry, Psychotic Symptoms:  Hallucinations: None PTSD Symptoms: Avoidance:  Decreased Interest/Participation Foreshortened Future Total Time spent with patient: 30 minutes  Past Psychiatric History:   Is the patient at risk to self? Yes.     Has the patient been a risk to self in the past 6 months? Yes.    Has the patient been a risk to self within the distant past? Yes.    Is the patient a risk to others? No.  Has the patient been a risk to others in the past 6 months? No.  Has the patient been a risk to others within the distant past? No.   Prior Inpatient Therapy: Prior Inpatient Therapy: Yes Prior Therapy Dates:  (2006, 2012) Prior Therapy Facilty/Provider(s):  [facility]) Reason for Treatment:  (MDD, SI) Prior Outpatient Therapy: Prior Outpatient Therapy: Yes Prior Therapy Dates:  (CURRENT) Prior Therapy Facilty/Provider(s):  (DR Margaretmary Lombard) Reason for Treatment:  (MDD, GAD) Does patient have an ACCT team?: No Does patient have Intensive In-House Services?  : No Does patient have [facility] services? : No Does patient have P4CC services?: No  Alcohol Screening: 1. How often do you have a drink containing alcohol?: 2 to 3 times a week 2. How many drinks containing alcohol do you have on a typical day when you are drinking?: 1 or 2 3. How often do you have six or more drinks on one occasion?: Less than monthly Preliminary Score: 1 4. How often during the last year have you found that you were not able to stop drinking once you had started?: Never 5. How often during the last year have you failed to do what was normally expected from you becasue of drinking?: Never 6. How often during the last year have you needed a first drink in the morning to get yourself going after a heavy drinking session?: Never 7. How often during the last year have you had a feeling of guilt of remorse after drinking?: Never 8. How often during the last year have you been unable to remember what happened the night before because you had been drinking?: Never 9. Have you or someone else been injured as a result of your drinking?: No 10. Has a relative or friend or a doctor or another health worker been concerned about your drinking or suggested  you cut down?: No Alcohol Use Disorder Identification Test Final Score (AUDIT): 4 Brief Intervention: AUDIT score less than 7 or less-screening does not suggest unhealthy drinking-brief intervention not indicated Substance Abuse History in the last 12 months:  No. Consequences of Substance Abuse: NA Previous Psychotropic Medications: no Psychological Evaluations: yes Past Medical History:  Past Medical History:  Diagnosis Date  . Depression   . Hepatitis B    History reviewed. No pertinent surgical history. Family History:  Family History  Problem Relation Age of Onset  . Hyperlipidemia Mother    Family Psychiatric  History: unknown- patient reports he is adpoted Tobacco Screening: Have you used any form of tobacco in the last 30 days? (Cigarettes, Smokeless Tobacco, Cigars, and/or Pipes): Yes Tobacco use, Select all that apply: 5 or more cigarettes per day Are you interested in Tobacco Cessation Medications?: No, patient refused Counseled patient on smoking cessation including recognizing danger situations, developing coping skills and basic information about quitting provided: Refused/Declined practical counseling Social History:  History  Alcohol Use  . Yes    Comment: occasional     History  Drug Use No    Additional Social History: Marital status:  Single    Prescriptions: NO PSYCH MEDS History of alcohol / drug use?: Yes Longest period of sobriety (when/how long): UNKNOWN Name of Substance 1: ALCOHOL 1 - Age of First Use: 13 1 - Amount (size/oz): 1-3 BEERS  1 - Frequency: 5 X WEEK 1 - Duration: ONGOING 1 - Last Use / Amount: 04/07/17 Name of Substance 2: CANNABIS 2 - Age of First Use: 15 2 - Amount (size/oz): "PASSING [place]" 2 - Frequency: 1 X MONTH 2 - Duration: ONGOING 2 - Last Use / Amount: A FEW MONTHS AGO Name of Substance 3: NICOTINE/CIGARETTES 3 - Age of First Use: 14 3 - Amount (size/oz): 1/2 - 1 PACK 3 - Frequency: DAILY 3 -  Duration: ONGOING 3 - Last Use / Amount: 04/07/17              Allergies:  No Known Allergies Lab Results:  Results for orders placed or performed during the hospital encounter of 04/08/17 (from the past 48 hour(s))  Rapid urine drug screen (hospital performed)     Status: None   Collection Time: 04/08/17  8:27 AM  Result Value Ref Range   Opiates NONE DETECTED NONE DETECTED   Cocaine NONE DETECTED NONE DETECTED   Benzodiazepines NONE DETECTED NONE DETECTED   Amphetamines NONE DETECTED NONE DETECTED   Tetrahydrocannabinol NONE DETECTED NONE DETECTED   Barbiturates NONE DETECTED NONE DETECTED    Comment:        DRUG SCREEN FOR MEDICAL PURPOSES ONLY.  IF CONFIRMATION IS NEEDED FOR ANY PURPOSE, NOTIFY LAB WITHIN 5 DAYS.        LOWEST DETECTABLE LIMITS FOR URINE DRUG SCREEN Drug Class       Cutoff (ng/mL) Amphetamine      1000 Barbiturate      200 Benzodiazepine   510 Tricyclics       258 Opiates          300 Cocaine          300 THC              50   Comprehensive metabolic panel     Status: Abnormal   Collection Time: 04/08/17  9:22 AM  Result Value Ref Range   Sodium 137 135 - 145 mmol/L   Potassium 3.9 3.5 - 5.1 mmol/L   Chloride 104 101 - 111 mmol/L   CO2 24 22 - 32 mmol/L   Glucose, Bld 120 (H) 65 - 99 mg/dL   BUN 17 6 - 20 mg/dL   Creatinine, Ser 0.69 0.61 - 1.24 mg/dL   Calcium 9.4 8.9 - 10.3 mg/dL   Total Protein 8.2 (H) 6.5 - 8.1 g/dL   Albumin 4.7 3.5 - 5.0 g/dL   AST 31 15 - 41 U/L   ALT 27 17 - 63 U/L   Alkaline Phosphatase 67 38 - 126 U/L   Total Bilirubin 0.7 0.3 - 1.2 mg/dL   GFR calc non Af Amer >60 >60 mL/min   GFR calc Af Amer >60 >60 mL/min    Comment: (NOTE) The eGFR has been calculated using the CKD EPI equation. This calculation has not been validated in all clinical situations. eGFR's persistently <60 mL/min signify possible Chronic Kidney Disease.    Anion gap 9 5 - 15  Ethanol     Status: None   Collection Time: 04/08/17  9:22 AM   Result Value Ref Range   Alcohol, Ethyl (B) <10 <10 mg/dL    Comment:  LOWEST DETECTABLE LIMIT FOR SERUM ALCOHOL IS 10 mg/dL FOR MEDICAL PURPOSES ONLY Please note change in reference range.   Salicylate level     Status: None   Collection Time: 04/08/17  9:22 AM  Result Value Ref Range   Salicylate Lvl <1.3 2.8 - 30.0 mg/dL  Acetaminophen level     Status: Abnormal   Collection Time: 04/08/17  9:22 AM  Result Value Ref Range   Acetaminophen (Tylenol), Serum <10 (L) 10 - 30 ug/mL    Comment:        THERAPEUTIC CONCENTRATIONS VARY SIGNIFICANTLY. A RANGE OF 10-30 ug/mL MAY BE AN EFFECTIVE CONCENTRATION FOR MANY PATIENTS. HOWEVER, SOME ARE BEST TREATED AT CONCENTRATIONS OUTSIDE THIS RANGE. ACETAMINOPHEN CONCENTRATIONS >150 ug/mL AT 4 HOURS AFTER INGESTION AND >50 ug/mL AT 12 HOURS AFTER INGESTION ARE OFTEN ASSOCIATED WITH TOXIC REACTIONS.   cbc     Status: None   Collection Time: 04/08/17  9:22 AM  Result Value Ref Range   WBC 7.2 4.0 - 10.5 K/uL   RBC 5.53 4.22 - 5.81 MIL/uL   Hemoglobin 16.9 13.0 - 17.0 g/dL   HCT 46.9 39.0 - 52.0 %   MCV 84.8 78.0 - 100.0 fL   MCH 30.6 26.0 - 34.0 pg   MCHC 36.0 30.0 - 36.0 g/dL   RDW 13.1 11.5 - 15.5 %   Platelets 199 150 - 400 K/uL    Blood Alcohol level:  Lab Results  Component Value Date   ETH <10 04/08/2017   ETH  07/13/2010    <5        LOWEST DETECTABLE LIMIT FOR SERUM ALCOHOL IS 5 mg/dL FOR MEDICAL PURPOSES ONLY    Metabolic Disorder Labs:  No results found for: HGBA1C, MPG No results found for: PROLACTIN No results found for: CHOL, TRIG, HDL, CHOLHDL, VLDL, LDLCALC  Current Medications: Current Facility-Administered Medications  Medication Dose Route Frequency Provider Last Rate Last Dose  . acetaminophen (TYLENOL) tablet 650 mg  650 mg Oral Q6H PRN Patrecia Pour, NP      . alum & mag hydroxide-simeth (MAALOX/MYLANTA) 200-200-20 MG/5ML suspension 30 mL  30 mL Oral Q4H PRN Patrecia Pour, NP      .  Derrill Memo ON 04/10/2017] FLUoxetine (PROZAC) 20 MG/5ML solution 20 mg  20 mg Oral Daily Izediuno, Vincent A, MD      . hydrOXYzine (ATARAX/VISTARIL) tablet 25 mg  25 mg Oral TID PRN Derrill Center, NP      . magnesium hydroxide (MILK OF MAGNESIA) suspension 30 mL  30 mL Oral Daily PRN Patrecia Pour, NP      . nicotine (NICODERM CQ - dosed in mg/24 hours) patch 21 mg  21 mg Transdermal Daily Derrill Center, NP      . traZODone (DESYREL) tablet 50 mg  50 mg Oral QHS,MR X 1 Derrill Center, NP       PTA Medications: No prescriptions prior to admission.    Musculoskeletal: Strength & Muscle Tone: within normal limits Gait & Station: normal Patient leans: N/A  Psychiatric Specialty Exam: Physical Exam  ROS  Blood pressure 108/71, pulse 85, temperature 97.8 F (36.6 C), temperature source Oral, resp. rate 16, height '5\' 2"'  (1.575 m), weight 68.9 kg (152 lb), SpO2 100 %.Body mass index is 27.8 kg/m.  General Appearance: Casual and Guarded  Eye Contact:  Fair  Speech:  Clear and Coherent muffled at this, due to hearing loss  Volume:  Normal  Mood:  Anxious and Depressed  Affect:  Congruent  Thought Process:  Coherent  Orientation:  Full (Time, Place, and Person)  Thought Content:  Hallucinations: None  Suicidal Thoughts:  Yes.  with intent/plan  Homicidal Thoughts:  No  Memory:  Immediate;   Fair Recent;   Fair Remote;   Fair  Judgement:  Fair  Insight:  Fair  Psychomotor Activity:  Normal  Concentration:  Concentration: Fair  Recall:  Good  Fund of Knowledge:  Fair  Language:  Fair  Akathisia:  No  Handed:  Right  AIMS (if indicated):     Assets:  Communication Skills Desire for Improvement Resilience Social Support  ADL's:  Intact  Cognition:  WNL  Sleep:  Number of Hours: 6.5    I agree with current treatment plan on 04/09/2017, Patient seen face-to-face for psychiatric evaluation follow-up, chart reviewed and case discussed with the MD Izediuno.Reviewed the information  documented and agree with the treatment plan.  Treatment Plan Summary: Daily contact with patient to assess and evaluate symptoms and progress in treatment and Medication management  Start Prozac 10 mg for mood stabilization. Continue with Trazodone 50 mg for insomnia  Will continue to monitor vitals ,medication compliance and treatment side effects while patient is here.  Reviewed labs: TSH pending ,BAL - , UDS -  CSW will start working on disposition.  Patient to participate in therapeutic milieu  Observation Level/Precautions:  15 minute checks  Laboratory:  CBC Chemistry Profile UDS UA  Psychotherapy:  Individual and group session  Medications:  See Above  Consultations:  CSW and Psychiatry  Discharge Concerns:  Safety, stabilization, and risk of access to medication and medication stabilization   Estimated LOS: 5-7days  Other:     Physician Treatment Plan for Primary Diagnosis: Major depressive disorder, recurrent severe without psychotic features ([place]) Long Term Goal(s): Improvement in symptoms so as ready for discharge  Short Term Goals: Ability to identify changes in lifestyle to reduce recurrence of condition will improve, Ability to verbalize feelings will improve, Ability to disclose and discuss suicidal ideas and Ability to demonstrate self-control will improve  Physician Treatment Plan for Secondary Diagnosis: Principal Problem:   Major depressive disorder, recurrent severe without psychotic features ([place])  Long Term Goal(s): Improvement in symptoms so as ready for discharge  Short Term Goals: Ability to identify and develop effective coping behaviors will improve, Ability to maintain clinical measurements within normal limits will improve and Compliance with prescribed medications will improve  I certify that inpatient services furnished can reasonably be expected to improve the patient's condition.    Derrill Center, NP 9/29/20182:17 PM

## 2017-04-09 NOTE — Progress Notes (Signed)
[name] is seen sitting in the dayroom this morning. HE is observed by this Clinical research associate watching TV and he socializes with another male patient. He is seen talking on the patient phone in the dayroom as well. A HE completed his daily assessment and on this he wrote he denied SI today and he rated his depression, hopelessness and anxiety " 3/3/4", respectively.He requested and was given a piece of nicorette gum ( for c/o nicotine withdrawal) and stated it helped. R Safety is in place and poc cont./

## 2017-04-09 NOTE — Progress Notes (Signed)
Writer has observed patient up in the dayroom watching tv with minimal interaction with peers. He reports that his day has been good and he has been working on his coping skills. He report that he does not need medication that is scheduled for sleep. Support given and safety maintained with 15 min checks.
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
## 2017-04-09 NOTE — [facility] Suicide Risk Assessment (Signed)
[facility] Admission Suicide Risk Assessment   Nursing information obtained from:    Demographic factors:    Current Mental Status:    Loss Factors:    Historical Factors:    Risk Reduction Factors:     Total Time spent with patient: 30 minutes Principal Problem: <principal problem not specified> Diagnosis:   Patient Active Problem List   Diagnosis Date Noted  . Major depressive disorder, recurrent severe without psychotic features (HCC) [F33.2] 04/08/2017   Subjective Data:  26 y.o Caucasian male, single, lives alone. Employed as a Investment banker, operational. Last worked two days ago. Background history of MDD. Responded well to Prozac in the past but weaned self off many years ago. Presented to the ER voluntarily . Reports worsening depression associated with suicidal thoughts. Went to [employer] and bought a rope. Had plans to hang himself. Reports stress from his finances and pressure at work. Routine  labs are essentially normal UDS is negative.  BAL <5 mg/dl. Patient has a gun at home. He has a past history of suicidal behavior and mental illness. Has physical disability. No associated psychosis. No associated violent thoughts towards others. No associated mania. Wants to get better. Has agreed to get restarted on Prozac. Would only take liquid formulary.    Continued Clinical Symptoms:  Alcohol Use Disorder Identification Test Final Score (AUDIT): 4 The "Alcohol Use Disorders Identification Test", Guidelines for Use in Primary Care, Second Edition.  World Science writer [facility]). Score between 0-7:  no or low risk or alcohol related problems. Score between 8-15:  moderate risk of alcohol related problems. Score between 16-19:  high risk of alcohol related problems. Score 20 or above:  warrants further diagnostic evaluation for alcohol dependence and treatment.   CLINICAL FACTORS:   Depression:   Severe   Musculoskeletal: Strength & Muscle Tone: within normal limits Gait & Station: normal Patient  leans: N/A  Psychiatric Specialty Exam: Physical Exam  ROS  Blood pressure 108/71, pulse 85, temperature 97.8 F (36.6 C), temperature source Oral, resp. rate 16, height  (1.575 m), weight 68.9 kg (152 lb), SpO2 100 %.Body mass index is 27.8 kg/m.  General Appearance: Neatly dressed, not in any distress. Engaged well.   Eye Contact:  Good  Speech:  Clear and Coherent and Normal Rate  Volume:  Normal  Mood:  Depressed  Affect:  Congruent  Thought Process:  Linear  Orientation:  Full (Time, Place, and Person)  Thought Content:  Rumination  Suicidal Thoughts:  Yes.  with intent/plan  Homicidal Thoughts:  No  Memory:  Immediate;   Good Recent;   Fair Remote;   Fair  Judgement:  Fair  Insight:  Good  Psychomotor Activity:  Normal  Concentration:  Concentration: Good and Attention Span: Good  Recall:  Fair   Fund of Knowledge:  Good  Language:  Good  Akathisia:  Negative  Handed:    AIMS (if indicated):     Assets:  Communication Skills Desire for Improvement Resilience  ADL's:  Intact  Cognition:  WNL  Sleep:  Number of Hours: 6.5      COGNITIVE FEATURES THAT CONTRIBUTE TO RISK:  None    SUICIDE RISK:   Mild:  Suicidal ideation of limited frequency, intensity, duration, and specificity.  There are no identifiable plans, no associated intent, mild dysphoria and related symptoms, good self-control (both objective and subjective assessment), few other risk factors, and identifiable protective factors, including available and accessible social support.  PLAN OF CARE:  1. Suicide precautions  2. Fluoxetine 20 mg daily 3.  SW would obtain collateral from his family/friends  I certify that inpatient services furnished can reasonably be expected to improve the patient's condition.   Georgiann Cocker, MD 04/09/2017, 12:13 PM

## 2017-04-09 NOTE — [facility] Group Notes (Signed)
Identifying Needs   Date:  04/09/2017  Time:  1100  Type of Therapy:  Nurse Education  The group focuses on teaching patients hwo to identify their needs and then how to develop skills needed to ge tthem met.  Participation Level:  Active  Participation Quality:  Attentive  Affect:  Appropriate  Cognitive:  Alert  Insight:  Good  Engagement in Group:  Engaged  Modes of Intervention:  Education  Summary of Progress/Problems:  [name] 04/09/2017, 4:20 PM

## 2017-04-10 DIAGNOSIS — H9190 Unspecified hearing loss, unspecified ear: Secondary | ICD-10-CM

## 2017-04-10 LAB — TSH: TSH: 2.223 u[IU]/mL (ref 0.350–4.500)

## 2017-04-10 MED ORDER — NICOTINE POLACRILEX 2 MG MT GUM
2.0000 mg | CHEWING_GUM | OROMUCOSAL | Status: DC | PRN
  Administered 2017-04-09: 15:00:00 via ORAL
  Administered 2017-04-10: 2 mg via ORAL

## 2017-04-10 MED ORDER — FLUOXETINE HCL 20 MG/5ML PO SOLN
20.0000 mg | Freq: Every day | ORAL | Status: DC
  Administered 2017-04-11 – 2017-04-12 (×2): 20 mg via ORAL
  Filled 2017-04-10 (×4): qty 5

## 2017-04-10 MED ORDER — FLUOXETINE HCL 20 MG PO CAPS
20.0000 mg | ORAL_CAPSULE | Freq: Every day | ORAL | Status: DC
  Filled 2017-04-10 (×3): qty 1

## 2017-04-10 NOTE — Progress Notes (Signed)
D: Patient's self inventory sheet: patient has good sleep, did not receive sleep medication.good  Appetite, normal energy level, good concentration. Rated depression 2/10, hopeless 1/10, anxiety 1/10. SI/HI/AVH: Denies all. Physical complaints are denied. Goal is "to come up with healthy coping skills". Plans to work on "self reflection".   A: Medications administered, assessed medication knowledge and education given on medication regimen.  Emotional support and encouragement given patient. R: Denies SI and HI , contracts for safety. Safety maintained with 15 minute checks.

## 2017-04-10 NOTE — [facility] Group Notes (Signed)
[facility] LCSW Group Therapy Note  Date/Time:  04/10/2017 10:00-11:00AM  Type of Therapy and Topic:  Group Therapy:  Healthy and Unhealthy Supports  Participation Level:  Active   Description of Group:  Patients in this group were introduced to the idea of adding a variety of healthy supports to address the various needs in their lives. The picture on the front of Sunday's workbook was used to demonstrate why more supports are needed in every patient's life.  Patients identified and described healthy supports versus unhealthy supports in general, then gave examples of each in their own lives.   They discussed what additional healthy supports could be helpful in their recovery and wellness after discharge in order to prevent future hospitalizations.   An emphasis was placed on using counselor, doctor, therapy groups, 12-step groups, and problem-specific support groups to expand supports.  They also worked as a group on developing a specific plan for several patients to deal with unhealthy supports through boundary-setting, psychoeducation with loved ones, and even termination of relationships.   Therapeutic Goals:   1)  discuss importance of adding supports to stay well once out of the hospital  2)  compare healthy versus unhealthy supports and identify some examples of each  3)  generate ideas and descriptions of healthy supports that can be added  4)  offer mutual support about how to address unhealthy supports  5)  encourage active participation in and adherence to discharge plan    Summary of Patient Progress:  The patient shared that the current healthy/unhealthy supports available in their life are parents and friends (healthy) and some people at work (unhealthy).  He participated enthusiastically and was insightful, stated he would really like to add yoga and/or meditation.   Therapeutic Modalities:   Motivational Interviewing Brief Solution-Focused Therapy  Ambrose Mantle,  LCSW 04/10/2017, 1:03 PM

## 2017-04-10 NOTE — Progress Notes (Signed)
Adult Psychoeducational Group Note  Date:  04/10/2017 Time:  2:20 AM  Group Topic/Focus:  Wrap-Up Group:   The focus of this group is to help patients review their daily goal of treatment and discuss progress on daily workbooks.  Participation Level:  Active  Participation Quality:  Appropriate  Affect:  Appropriate  Cognitive:  Appropriate  Insight: Appropriate  Engagement in Group:  Engaged  Modes of Intervention:  Discussion  Additional Comments:  Pt stated his goal for today was to identify signs that helps him deal with his suicidal thoughts. Pt state he was able accomplish his task today and felt good about it. Pt stated he even started to look at coping skills that will aide in the process as well. Pt rated his over all day a 8 out of 10.    [name] 04/10/2017, 2:20 AM

## 2017-04-10 NOTE — Progress Notes (Signed)
[facility] MD Progress Note  04/10/2017 11:16 AM [name]  MRN:  [id number]   Subjective:  [name] reports " I am feeling okay, I guess"  Objective: [name] is awake, alert and oriented , found attending group session.  Denies suicidal or homicidal ideation during this assessment. Repots his mood has improved since benign here.  Denies auditory or visual hallucination and does not appear to be responding to internal stimuli. Patient reports attending group session. States he has learned alterative coping skills.  Patient reports he is medication compliant with the Prozac without mediation side effects. Patient reports " little depression." Reports good appetite other wise and resting well.  Support, encouragement and reassurance was provided.   Principal Problem: Major depressive disorder, recurrent severe without psychotic features (HCC) Diagnosis:   Patient Active Problem List   Diagnosis Date Noted  . Major depressive disorder, recurrent severe without psychotic features (HCC) [F33.2] 04/08/2017   Total Time spent with patient: 30 minutes  Past Psychiatric History:  Past Medical History:  Past Medical History:  Diagnosis Date  . Depression   . Hepatitis B    History reviewed. No pertinent surgical history. Family History:  Family History  Problem Relation Age of Onset  . Hyperlipidemia Mother    Family Psychiatric  History:  Social History:  History  Alcohol Use  . Yes    Comment: occasional     History  Drug Use No    Social History   Social History  . Marital status: Single    Spouse name: N/A  . Number of children: N/A  . Years of education: N/A   Social History Main Topics  . Smoking status: Current Every Day Smoker    Packs/day: 0.50    Last attempt to quit: 04/19/2012  . Smokeless tobacco: Never Used  . Alcohol use Yes     Comment: occasional  . Drug use: No  . Sexual activity: Not Asked   Other Topics Concern  . None   Social History  Narrative  . None   Additional Social History:    Prescriptions: NO PSYCH MEDS History of alcohol / drug use?: Yes Longest period of sobriety (when/how long): UNKNOWN Name of Substance 1: ALCOHOL 1 - Age of First Use: 13 1 - Amount (size/oz): 1-3 BEERS  1 - Frequency: 5 X WEEK 1 - Duration: ONGOING 1 - Last Use / Amount: 04/07/17 Name of Substance 2: CANNABIS 2 - Age of First Use: 15 2 - Amount (size/oz): "PASSING JOINT AMONG FRIENDS SOCIALLY" 2 - Frequency: 1 X MONTH 2 - Duration: ONGOING 2 - Last Use / Amount: A FEW MONTHS AGO Name of Substance 3: NICOTINE/CIGARETTES 3 - Age of First Use: 14 3 - Amount (size/oz): 1/2 - 1 PACK 3 - Frequency: DAILY 3 - Duration: ONGOING 3 - Last Use / Amount: 04/07/17              Sleep: Fair  Appetite:  Good  Current Medications: Current Facility-Administered Medications  Medication Dose Route Frequency Provider Last Rate Last Dose  . acetaminophen (TYLENOL) tablet 650 mg  650 mg Oral Q6H PRN Charm Rings, NP      . alum & mag hydroxide-simeth (MAALOX/MYLANTA) 200-200-20 MG/5ML suspension 30 mL  30 mL Oral Q4H PRN Charm Rings, NP      . FLUoxetine (PROZAC) capsule 20 mg  20 mg Oral Daily Oneta Rack, NP      . hydrOXYzine (ATARAX/VISTARIL) tablet 25 mg  25 mg Oral TID PRN Oneta Rack, NP      . magnesium hydroxide (MILK OF MAGNESIA) suspension 30 mL  30 mL Oral Daily PRN Charm Rings, NP      . nicotine (NICODERM CQ - dosed in mg/24 hours) patch 21 mg  21 mg Transdermal Daily Oneta Rack, NP   21 mg at 04/09/17 1415  . traZODone (DESYREL) tablet 50 mg  50 mg Oral QHS,MR X 1 Oneta Rack, NP        Lab Results:  Results for orders placed or performed during the hospital encounter of 04/08/17 (from the past 48 hour(s))  TSH     Status: None   Collection Time: 04/10/17  7:13 AM  Result Value Ref Range   TSH 2.223 0.350 - 4.500 uIU/mL    Comment: Performed by a 3rd Generation assay with a functional  sensitivity of <=0.01 uIU/mL. Performed at [facility], [address], [place], [place] [address]     Blood Alcohol level:  Lab Results  Component Value Date   ETH <10 04/08/2017   [facility]  07/13/2010    <5        LOWEST DETECTABLE LIMIT FOR SERUM ALCOHOL IS 5 mg/dL FOR MEDICAL PURPOSES ONLY    Metabolic Disorder Labs: No results found for: HGBA1C, MPG No results found for: PROLACTIN No results found for: CHOL, TRIG, HDL, CHOLHDL, VLDL, LDLCALC  Physical Findings: AIMS: Facial and Oral Movements Muscles of Facial Expression: None, normal Lips and Perioral Area: None, normal Jaw: None, normal Tongue: None, normal,Extremity Movements Upper (arms, wrists, hands, fingers): None, normal Lower (legs, knees, ankles, toes): None, normal, Trunk Movements Neck, shoulders, hips: None, normal, Overall Severity Severity of abnormal movements (highest score from questions above): None, normal Incapacitation due to abnormal movements: None, normal Patient's awareness of abnormal movements (rate only patient's report): No Awareness, Dental Status Current problems with teeth and/or dentures?: No Does patient usually wear dentures?: No  CIWA:    COWS:     Musculoskeletal: Strength & Muscle Tone: within normal limits Gait & Station: normal Patient leans: N/A  Psychiatric Specialty Exam: Physical Exam  Nursing note and vitals reviewed. Constitutional: He is oriented to person, place, and time. He appears well-developed.  Cardiovascular: Normal rate.   Neurological: He is alert and oriented to person, place, and time.  Psychiatric: He has a normal mood and affect. His behavior is normal.    Review of Systems  Psychiatric/Behavioral: Positive for depression. Negative for suicidal ideas. The patient is nervous/anxious.     Blood pressure 114/75, pulse 79, temperature 97.7 F (36.5 C), temperature source Oral, resp. rate 16, height  (1.575 m), weight 68.9 kg (152  lb), SpO2 100 %.Body mass index is 27.8 kg/m.  General Appearance: Casual  Eye Contact:  Fair  Speech:  Clear and Coherent, slightly mumble due to hearing loss  Volume:  Normal  Mood:  Anxious and Depressed  Affect:  Congruent and Depressed  Thought Process:  Coherent  Orientation:  Full (Time, Place, and Person)  Thought Content:  Hallucinations: None  Suicidal Thoughts:  No  Homicidal Thoughts:  No  Memory:  Immediate;   Fair Recent;   Fair Remote;   Fair  Judgement:  Fair  Insight:  Fair  Psychomotor Activity:  Restlessness  Concentration:  Concentration: Fair  Recall:  [employer] of Knowledge:  Fair  Language:  Fair  Akathisia:  No  Handed:  Right  AIMS (  if indicated):     Assets:  Desire for Improvement Resilience Social Support  ADL's:  Intact  Cognition:  WNL  Sleep:  Number of Hours: 6.5     I agree with current treatment plan on 04/10/2017, Patient seen face-to-face for psychiatric evaluation follow-up, chart reviewed. Reviewed the information documented and agree with the treatment plan.  Treatment Plan Summary: Daily contact with patient to assess and evaluate symptoms and progress in treatment and Medication management   Continue Prozac 20 mg for mood stabilization. Continue with Trazodone 50 mg for insomnia  Will continue to monitor vitals ,medication compliance and treatment side effects while patient is here.  Reviewed labs: TSH pending ,BAL - , UDS -  CSW will start working on disposition.  Patient to participate in therapeutic milieu   Oneta Rack, NP 04/10/2017, 11:16 AM

## 2017-04-10 NOTE — Progress Notes (Signed)
Writer has observed patient up in the dayroom watching tv and playing solitaire. He reports his day has been good and he has enjoyed the groups today. He was informed of his hs medication and declined to take it reporting that he was fine without it last night. He is hoping to speak with the doctor on tomorrow concerning his discharge. Support given and safety maintained on unit with 15 min checks.

## 2017-04-11 DIAGNOSIS — R450 Nervousness: Secondary | ICD-10-CM

## 2017-04-11 DIAGNOSIS — F419 Anxiety disorder, unspecified: Secondary | ICD-10-CM

## 2017-04-11 NOTE — [facility] Suicide Risk Assessment (Signed)
[facility] INPATIENT:  Family/Significant Other Suicide Prevention Education  Suicide Prevention Education:  Contact Attempts: father, Alister Staver [phone], (name of family member/significant other) has been identified by the patient as the family member/significant other with whom the patient will be residing, and identified as the person(s) who will aid the patient in the event of a mental health crisis.  With written consent from the patient, two attempts were made to provide suicide prevention education, prior to and/or following the patient's discharge.  We were unsuccessful in providing suicide prevention education.  A suicide education pamphlet was given to the patient to share with family/significant other.  Date and time of first attempt: 10/1 at 3:15; VM not identified so CSW did not leave message  Sallee Lange 04/11/2017, 3:08 PM

## 2017-04-11 NOTE — Plan of Care (Signed)
Problem: Health Behavior/Discharge Planning: Goal: Compliance with treatment plan for underlying cause of condition will improve Outcome: Progressing Patient taking medications and attending groups per plan of care. Patient verbalizes understanding and is agreeable to current plan of care.  Problem: Safety: Goal: Periods of time without injury will increase Outcome: Progressing Patient is on q15 minute safety checks and low fall risk precautions. Patient contracts for safety on the unit and remains safe at this time.   

## 2017-04-11 NOTE — Progress Notes (Signed)
Nursing Progress Note [id number]  D) Patient presents pleasant, calm and cooperative. Patient attended group this evening and was up in the milieu. Patient states he is looking forward to discharge tomorrow. Patient denies SI/HI/AVH or pain. Patient contracts for safety on the unit. Patient reports sleeping well without medications and denies need for Trazodone.  A) Emotional support given. 1:1 interaction and active listening provided. Trazodone not provided this evening per patient request. Medications and plan of care reviewed with patient. Patient verbalized understanding without further questions. Snacks and fluids provided. Opportunities for questions or concerns presented to patient. Patient encouraged to continue to work on treatment goals. Labs, vital signs and patient behavior monitored throughout shift. Patient safety maintained with q15 min safety checks. Low fall risk precautions in place and reviewed with patient; patient verbalized understanding.  R) Patient receptive to interaction with nurse. Patient remains safe on the unit at this time. Patient denies any adverse medication reactions at this time. Patient is resting in bed without complaints. Will continue to monitor.

## 2017-04-11 NOTE — Progress Notes (Signed)
Adult Psychoeducational Group Note  Date:  04/11/2017 Time:  9:38 PM  Group Topic/Focus:  Wrap-Up Group:   The focus of this group is to help patients review their daily goal of treatment and discuss progress on daily workbooks.  Participation Level:  Active  Participation Quality:  Appropriate  Affect:  Appropriate  Cognitive:  Appropriate  Insight: Appropriate  Engagement in Group:  Engaged  Modes of Intervention:  Discussion  Additional Comments: Pt stated his goal for today was to talk with doctor about his discharge plan. Pt stated he accomplished his goals and felt good about it. Pt stated he attended all groups held today.   Felipa Furnace 04/11/2017, 9:38 PM

## 2017-04-11 NOTE — Progress Notes (Signed)
Adult Psychoeducational Group Note  Date:  04/11/2017 Time:  2:32 AM  Group Topic/Focus:  Wrap-Up Group:   The focus of this group is to help patients review their daily goal of treatment and discuss progress on daily workbooks.  Participation Level:  Active  Participation Quality:  Attentive  Affect:  Appropriate  Cognitive:  Appropriate  Insight: Appropriate  Engagement in Group:  Engaged  Modes of Intervention:  Discussion  Additional Comments:  Pt stated his goal for today was to find copying skills to aid with depression. Pt stated he achieved his goal. Pt rated his over all day a 9 out of 10. Pt stated he attended are groups held today.   Felipa Furnace 04/11/2017, 2:32 AM

## 2017-04-11 NOTE — Progress Notes (Signed)
[facility] MD Progress Note  04/11/2017 3:23 PM [name]  MRN:  [id number]   Subjective:  Patient reports he is feeling better, and at this time is future oriented, and focusing on disposition planning. Denies medication side effects. Denies any suicidal or self injurious ideations.    Objective:  I have discussed case with treatment team and have met with patient. 26 year old male, employed Education officer, environmental) , presented to hospital due to depression and suicidal ideations, with thoughts of hanging self .  At this time he reports feeling much better, and denies significant depression or current neuro-vegetative symptoms of depression . Denies suicidal ideations, and is future oriented, hoping for discharge soon and to return to work. Staff reports note that patient has been pleasant, interactive with peers , visible on unit. Has reported mood as improved and minimizes depression/sadness at this time.  No disruptive or agitated behaviors on unit. Currently on Prozac ( liquid, states he does not tolerate tablets or capsules well )- states he was on this medication in the past with good response .Denies medication side effects.  Labs- TSH WNL.    Principal Problem: Major depressive disorder, recurrent severe without psychotic features ([place]) Diagnosis:   Patient Active Problem List   Diagnosis Date Noted  . Major depressive disorder, recurrent severe without psychotic features ([place]) [F33.2] 04/08/2017   Total Time spent with patient: 20 minutes  Past Psychiatric History:  Past Medical History:  Past Medical History:  Diagnosis Date  . Depression   . Hepatitis B    History reviewed. No pertinent surgical history. Family History:  Family History  Problem Relation Age of Onset  . Hyperlipidemia Mother    Family Psychiatric  History:  Social History:  History  Alcohol Use  . Yes    Comment: occasional     History  Drug Use No    Social History   Social History  . Marital status:  Single    Spouse name: N/A  . Number of children: N/A  . Years of education: N/A   Social History Main Topics  . Smoking status: Current Every Day Smoker    Packs/day: 0.50    Last attempt to quit: 04/19/2012  . Smokeless tobacco: Never Used  . Alcohol use Yes     Comment: occasional  . Drug use: No  . Sexual activity: Not Asked   Other Topics Concern  . None   Social History Narrative  . None   Additional Social History:    Prescriptions: NO PSYCH MEDS History of alcohol / drug use?: Yes Longest period of sobriety (when/how long): UNKNOWN Name of Substance 1: ALCOHOL 1 - Age of First Use: 13 1 - Amount (size/oz): 1-3 BEERS  1 - Frequency: 5 X WEEK 1 - Duration: ONGOING 1 - Last Use / Amount: 04/07/17 Name of Substance 2: CANNABIS 2 - Age of First Use: 15 2 - Amount (size/oz): "PASSING JOINT AMONG FRIENDS SOCIALLY" 2 - Frequency: 1 X MONTH 2 - Duration: ONGOING 2 - Last Use / Amount: A FEW MONTHS AGO Name of Substance 3: NICOTINE/CIGARETTES 3 - Age of First Use: 14 3 - Amount (size/oz): 1/2 - 1 PACK 3 - Frequency: DAILY 3 - Duration: ONGOING 3 - Last Use / Amount: 04/07/17  Sleep: improved   Appetite:  Good  Current Medications: Current Facility-Administered Medications  Medication Dose Route Frequency Provider Last Rate Last Dose  . acetaminophen (TYLENOL) tablet 650 mg  650 mg Oral Q6H PRN Lord,  Asa Saunas, NP      . alum & mag hydroxide-simeth (MAALOX/MYLANTA) 200-200-20 MG/5ML suspension 30 mL  30 mL Oral Q4H PRN Patrecia Pour, NP      . FLUoxetine (PROZAC) 20 MG/5ML solution 20 mg  20 mg Oral Daily Derrill Center, NP   20 mg at 04/11/17 0807  . hydrOXYzine (ATARAX/VISTARIL) tablet 25 mg  25 mg Oral TID PRN Derrill Center, NP      . magnesium hydroxide (MILK OF MAGNESIA) suspension 30 mL  30 mL Oral Daily PRN Patrecia Pour, NP      . nicotine polacrilex (NICORETTE) gum 2 mg  2 mg Oral PRN Derrill Center, NP   2 mg at 04/10/17 1422  . traZODone  (DESYREL) tablet 50 mg  50 mg Oral QHS,MR X 1 Derrill Center, NP        Lab Results:  Results for orders placed or performed during the hospital encounter of 04/08/17 (from the past 48 hour(s))  TSH     Status: None   Collection Time: 04/10/17  7:13 AM  Result Value Ref Range   TSH 2.223 0.350 - 4.500 uIU/mL    Comment: Performed by a 3rd Generation assay with a functional sensitivity of <=0.01 uIU/mL. Performed at [facility], [place] [address], [place], [place] [address]     Blood Alcohol level:  Lab Results  Component Value Date   ETH <10 04/08/2017   [facility]  07/13/2010    <5        LOWEST DETECTABLE LIMIT FOR SERUM ALCOHOL IS 5 mg/dL FOR MEDICAL PURPOSES ONLY    Metabolic Disorder Labs: No results found for: HGBA1C, MPG No results found for: PROLACTIN No results found for: CHOL, TRIG, HDL, CHOLHDL, VLDL, LDLCALC  Physical Findings: AIMS: Facial and Oral Movements Muscles of Facial Expression: None, normal Lips and Perioral Area: None, normal Jaw: None, normal Tongue: None, normal,Extremity Movements Upper (arms, wrists, hands, fingers): None, normal Lower (legs, knees, ankles, toes): None, normal, Trunk Movements Neck, shoulders, hips: None, normal, Overall Severity Severity of abnormal movements (highest score from questions above): None, normal Incapacitation due to abnormal movements: None, normal Patient's awareness of abnormal movements (rate only patient's report): No Awareness, Dental Status Current problems with teeth and/or dentures?: No Does patient usually wear dentures?: No  CIWA:  CIWA-Ar Total: 1 COWS:     Musculoskeletal: Strength & Muscle Tone: within normal limits Gait & Station: normal Patient leans: N/A  Psychiatric Specialty Exam: Physical Exam  Nursing note and vitals reviewed. Constitutional: He is oriented to person, place, and time. He appears well-developed.  Cardiovascular: Normal rate.   Neurological: He is alert  and oriented to person, place, and time.  Psychiatric: He has a normal mood and affect. His behavior is normal.    Review of Systems  Psychiatric/Behavioral: Positive for depression. Negative for suicidal ideas. The patient is nervous/anxious.   no chest pain, no dyspnea  Blood pressure 113/76, pulse 76, temperature 97.6 F (36.4 C), temperature source Oral, resp. rate 16, height '5\' 2"'  (1.575 m), weight 68.9 kg (152 lb), SpO2 100 %.Body mass index is 27.8 kg/m.  General Appearance: Well Groomed  Eye Contact:  Good  Speech:  Clear and Coherent  Volume:  Normal  Mood:  reports he is feeling better and denies feeling depressed at this time   Affect:  Appropriate and more reactive - smiles appropriately during session  Thought Process:  Linear and Descriptions of Associations:  Intact  Orientation:  Full (Time, Place, and Person)  Thought Content:  no hallucinations, no delusions, not internally preoccupied   Suicidal Thoughts:  No denies any suicidal or self injurious ideations, denies any homicidal or violent ideations   Homicidal Thoughts:  No  Memory:   Recent and remote grossly intact   Judgement:  Fair- improving   Insight:  Fair- improving   Psychomotor Activity:  Normal  Concentration:  Concentration: Good and Attention Span: Good  Recall:  Good  Fund of Knowledge:  Good  Language:  Good  Akathisia:  No  Handed:  Right  AIMS (if indicated):     Assets:  Desire for Improvement Resilience Social Support  ADL's:  Intact  Cognition:  WNL  Sleep:  Number of Hours: 6.75    Assessment - patient reports improving mood , denies significant depression or neuro-vegetative symptoms of depression at this time, denies suicidal ideations, and affect presents reactive and appropriate.  He is visible on unit, interactive with peers, pleasant on approach. Thus far tolerating Prozac trial well .   Treatment Plan Summary: Daily contact with patient to assess and evaluate symptoms and  progress in treatment and Medication management  Treatment plan reviewed as below today 10/1 Encourage group and milieu participation to work on coping skills and symptom reduction Continue Prozac 20 mg QDAY for mood stabilization. ( prefers liquid form over capsule )  Continue  Trazodone 50 mg QHS PRN  for insomnia Continue Vistaril 25 mgrs Q 8 hours PRN for anxiety as needed      Jenne Campus, MD 04/11/2017, 3:23 PM   Patient ID: Mendel Corning, male   DOB: [birth year], 26 y.o.   MRN: [id number]

## 2017-04-11 NOTE — [facility] Suicide Risk Assessment (Signed)
[facility] INPATIENT:  Family/Significant Other Suicide Prevention Education  Suicide Prevention Education:  Contact Attempts: [name], father, [phone] of family member/significant other) has been identified by the patient as the family member/significant other with whom the patient will be residing, and identified as the person(s) who will aid the patient in the event of a mental health crisis.  With written consent from the patient, two attempts were made to provide suicide prevention education, prior to and/or following the patient's discharge.  We were unsuccessful in providing suicide prevention education.  A suicide education pamphlet was given to the patient to share with family/significant other.   Date and time of second attempt: 10/1 at 5:30 PM  [name] 04/11/2017, 5:51 PM

## 2017-04-11 NOTE — Tx Team (Signed)
Interdisciplinary Treatment and Diagnostic Plan Update  04/11/2017 Time of Session: 9:00 AM [name] MRN: [id number]  Principal Diagnosis: Major depressive disorder, recurrent severe without psychotic features (HCC)  Secondary Diagnoses: Principal Problem:   Major depressive disorder, recurrent severe without psychotic features (H[facility])   Current Medications:  Current Facility-Administered Medications  Medication Dose Route Frequency Provider Last Rate Last Dose  . acetaminophen (TYLENOL) tablet 650 mg  650 mg Oral Q6H PRN Charm Rings, NP      . alum & mag hydroxide-simeth (MAALOX/MYLANTA) 200-200-20 MG/5ML suspension 30 mL  30 mL Oral Q4H PRN Charm Rings, NP      . FLUoxetine (PROZAC) 20 MG/5ML solution 20 mg  20 mg Oral Daily Oneta Rack, NP   20 mg at 04/11/17 0807  . hydrOXYzine (ATARAX/VISTARIL) tablet 25 mg  25 mg Oral TID PRN Oneta Rack, NP      . magnesium hydroxide (MILK OF MAGNESIA) suspension 30 mL  30 mL Oral Daily PRN Charm Rings, NP      . nicotine polacrilex (NICORETTE) gum 2 mg  2 mg Oral PRN Oneta Rack, NP   2 mg at 04/10/17 1422  . traZODone (DESYREL) tablet 50 mg  50 mg Oral QHS,MR X 1 Oneta Rack, NP       PTA Medications: No prescriptions prior to admission.    Patient Stressors: Loss of romantic relationship Marital or family conflict Occupational concerns  Patient Strengths: Ability for insight Average or above average intelligence Capable of independent living General fund of knowledge Motivation for treatment/growth Supportive family/friends  Treatment Modalities: Medication Management, Group therapy, Case management,  1 to 1 session with clinician, Psychoeducation, Recreational therapy.   Physician Treatment Plan for Primary Diagnosis: Major depressive disorder, recurrent severe without psychotic features (HCC) Long Term Goal(s): Improvement in symptoms so as ready for discharge Improvement in symptoms so as  ready for discharge   Short Term Goals: Ability to identify changes in lifestyle to reduce recurrence of condition will improve Ability to verbalize feelings will improve Ability to disclose and discuss suicidal ideas Ability to demonstrate self-control will improve Ability to identify and develop effective coping behaviors will improve Ability to maintain clinical measurements within normal limits will improve Compliance with prescribed medications will improve  Medication Management: Evaluate patient's response, side effects, and tolerance of medication regimen.  Therapeutic Interventions: 1 to 1 sessions, Unit Group sessions and Medication administration.  Evaluation of Outcomes: Progressing  Physician Treatment Plan for Secondary Diagnosis: Principal Problem:   Major depressive disorder, recurrent severe without psychotic features (HCC)  Long Term Goal(s): Improvement in symptoms so as ready for discharge Improvement in symptoms so as ready for discharge   Short Term Goals: Ability to identify changes in lifestyle to reduce recurrence of condition will improve Ability to verbalize feelings will improve Ability to disclose and discuss suicidal ideas Ability to demonstrate self-control will improve Ability to identify and develop effective coping behaviors will improve Ability to maintain clinical measurements within normal limits will improve Compliance with prescribed medications will improve     Medication Management: Evaluate patient's response, side effects, and tolerance of medication regimen.  Therapeutic Interventions: 1 to 1 sessions, Unit Group sessions and Medication administration.  Evaluation of Outcomes: Progressing   RN Treatment Plan for Primary Diagnosis: Major depressive disorder, recurrent severe without psychotic features (HCC) Long Term Goal(s): Knowledge of disease and therapeutic regimen to maintain health will improve  Short Term Goals: Ability to  remain free from injury will improve, Ability to demonstrate self-control, Ability to disclose and discuss suicidal ideas and Ability to identify and develop effective coping behaviors will improve  Medication Management: RN will administer medications as ordered by provider, will assess and evaluate patient's response and provide education to patient for prescribed medication. RN will report any adverse and/or side effects to prescribing provider.  Therapeutic Interventions: 1 on 1 counseling sessions, Psychoeducation, Medication administration, Evaluate responses to treatment, Monitor vital signs and CBGs as ordered, Perform/monitor CIWA, COWS, AIMS and Fall Risk screenings as ordered, Perform wound care treatments as ordered.  Evaluation of Outcomes: Progressing   LCSW Treatment Plan for Primary Diagnosis: Major depressive disorder, recurrent severe without psychotic features (HCC) Long Term Goal(s): Safe transition to appropriate next level of care at discharge, Engage patient in therapeutic group addressing interpersonal concerns.  Short Term Goals: Engage patient in aftercare planning with referrals and resources, Increase ability to appropriately verbalize feelings, Increase emotional regulation and Increase skills for wellness and recovery  Therapeutic Interventions: Assess for all discharge needs, 1 to 1 time with Social worker, Explore available resources and support systems, Assess for adequacy in community support network, Educate family and significant other(s) on suicide prevention, Complete Psychosocial Assessment, Interpersonal group therapy.  Evaluation of Outcomes: Progressing   Progress in Treatment: Attending groups: Yes. Participating in groups: Yes. Taking medication as prescribed: Yes. Toleration medication: Yes. Family/Significant other contact made: No, will contact:  father, will also assess whether firearm is secured/removed Patient understands diagnosis:  Yes. Discussing patient identified problems/goals with staff: Yes. Medical problems stabilized or resolved: Yes. Denies suicidal/homicidal ideation: Yes. and As evidenced by:  admitted w suicidal ideation and plan to hang self, also has access to firearn; treatment team assessing Issues/concerns per patient self-inventory: No. Other: NA  New problem(s) identified: Yes, Describe:  assess access to firearms in community, RN to complete suicide safety plan  New Short Term/Long Term Goal(s):  "I was suicidal, getting away from my stressors, coping skills."  Discharge Plan or Barriers: return home, follow up outpatient  Reason for Continuation of Hospitalization: Depression Medication stabilization Suicidal ideation  Estimated Length of Stay:  3 - 5 days; 04/13/17  Attendees: Patient: [name] 04/11/2017 9:09 AM  Physician: Sallyanne Havers MD 04/11/2017 9:09 AM  Nursing: Meriam Sprague RN 04/11/2017 9:09 AM  RN Care Manager: 04/11/2017 9:09 AM  Social Worker: Governor Rooks LCSW 04/11/2017 9:09 AM  Recreational Therapist:  04/11/2017 9:09 AM  Other:  04/11/2017 9:09 AM  Other:  04/11/2017 9:09 AM  Other: 04/11/2017 9:09 AM    Scribe for Treatment Team: Sallee Lange, LCSW 04/11/2017 9:09 AM

## 2017-04-11 NOTE — Progress Notes (Signed)
D: Patient is observed on the unit, interacting appropriately with staff and others. Pleasant mood and affect, assertive communication/interaction. Patient reports per self inventory sheet that he has slept "fair" without sleep medication, has had "good" appetite, "normal" energy level, and "good" concentration. Rates depression: "1",  Hopelessness: "1", and anxiety "2" (0-10). Denies physical complaints of any kind. States goal for today is to "work on discharge plan". Plans to accomplish this goal by "talking to Doctor". Contracts for safety at this time. Denies A/V hallucinations. A: Support and encouragement offered. Encouraged to talk to staff if feelings of harm toward self or others arise. Patient agrees. R: No further complaints or problems at this time. Patient is smiling and thanks this Clinical research associate during encounter. Patient is safe and 15 minute checks maintained for safety.

## 2017-04-11 NOTE — Progress Notes (Signed)
Recreation Therapy Notes  Date: 04/11/17 Time: 0930 Location: [address] Day[address]  Group Topic: Stress Management  Goal Area(s) Addresses:  Patient will verbalize importance of using healthy stress management.  Patient will identify positive emotions associated with healthy stress management.   Intervention: Stress Management  Activity :  LRT introduced the stress management technique of guided imagery.  LRT read a script that allowed patients to take a mental vacation to the beach.  Patients were to follow along as LRT read script to fully engage in the technique.  Education:  Stress Management, Discharge Planning.   Education Outcome: Acknowledges edcuation/In group clarification offered/Needs additional education  Clinical Observations/Feedback: Pt did not attend group.   Yuliza Cara, LRT/CTRS         Falynn Ailey A 04/11/2017 1:35 PM 

## 2017-04-11 NOTE — [facility] Group Notes (Signed)
LCSW Group Therapy Note   04/11/2017 1:15pm   Type of Therapy and Topic:  Group Therapy:  Overcoming Obstacles   Participation Level:  Active   Description of Group:    In this group patients will be encouraged to explore what they see as obstacles to their own wellness and recovery. They will be guided to discuss their thoughts, feelings, and behaviors related to these obstacles. The group will process together ways to cope with barriers, with attention given to specific choices patients can make. Each patient will be challenged to identify changes they are motivated to make in order to overcome their obstacles. This group will be process-oriented, with patients participating in exploration of their own experiences as well as giving and receiving support and challenge from other group members.   Therapeutic Goals: 1. Patient will identify personal and current obstacles as they relate to admission. 2. Patient will identify barriers that currently interfere with their wellness or overcoming obstacles.  3. Patient will identify feelings, thought process and behaviors related to these barriers. 4. 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  Was able to interact positively w others and give/receive support appropriately.      Therapeutic Modalities:   Cognitive Behavioral Therapy Solution Focused Therapy Motivational Interviewing Relapse Prevention Therapy  Sallee Lange, LCSW 04/11/2017 4:55 PM

## 2017-04-11 NOTE — Progress Notes (Signed)
PATIENT SIGNED WITHDRAW REQUEST FOR DISCHARGE FROM [facility] ON 04/11/2017 AT 1500.

## 2017-04-11 NOTE — [facility] Suicide Risk Assessment (Signed)
[facility] INPATIENT:  Family/Significant Other Suicide Prevention Education  Suicide Prevention Education:  Contact Attempts: Hildred Laser, wife, [phone], (name of family member/significant other) has been identified by the patient as the family member/significant other with whom the patient will be residing, and identified as the person(s) who will aid the patient in the event of a mental health crisis.  With written consent from the patient, two attempts were made to provide suicide prevention education, prior to and/or following the patient's discharge.  We were unsuccessful in providing suicide prevention education.  A suicide education pamphlet was given to the patient to share with family/significant other.  Date and time of second attempt: 10/1 at 5:30 PM  Sallee Lange 04/11/2017, 5:53 PM

## 2017-04-12 MED ORDER — TRAZODONE HCL 50 MG PO TABS: 50.0000 mg | ORAL_TABLET | Freq: Every evening | ORAL | 0 refills | Status: DC | PRN

## 2017-04-12 MED ORDER — FLUOXETINE HCL 20 MG/5ML PO SOLN: 20.0000 mg | Freq: Every day | ORAL | 0 refills | Status: DC

## 2017-04-12 MED ORDER — HYDROXYZINE HCL 25 MG PO TABS: 25.0000 mg | ORAL_TABLET | Freq: Three times a day (TID) | ORAL | 0 refills | Status: DC | PRN

## 2017-04-12 NOTE — Progress Notes (Signed)
Recreation Therapy Notes  Animal-Assisted Activity (AAA) Program Checklist/Progress Notes Patient Eligibility Criteria Checklist & Daily Group note for Rec TxIntervention  Date: 10.02.2018 Time: 11:25am Location: [address]   AAA/T Program Assumption of Risk Form signed by Patient/ or Parent Legal Guardian Yes  Patient is free of allergies or sever asthma Yes  Patient reports no fear of animals Yes  Patient reports no history of cruelty to animals Yes  Patient understands his/her participation is voluntary Yes  Patient washes hands before animal contact Yes  Patient washes hands after animal contact Yes  Behavioral Response: Appropriate   Education:Hand Washing, Appropriate Animal Interaction   Education Outcome: Acknowledges education.   Clinical Observations/Feedback: Patient attended session and interacted appropriately with therapy dog and peers.   Marykay Lex [place] Brogden, LRT/CTRS        Keria Widrig L 04/12/2017 2:30 PM

## 2017-04-12 NOTE — Discharge Summary (Signed)
Physician Discharge Summary Note  Patient:  [name] is an 26 y.o., male MRN:  [id number] DOB:  02/22/1991 Patient phone:  [phone] (home)  Patient address:   [address] [place] [address],  Total Time spent with patient: 20 minutes  Date of Admission:  04/08/2017 Date of Discharge: 04/12/17  Reason for Admission:  Worsening depression with SI and plan to hang self  Principal Problem: Major depressive disorder, recurrent severe without psychotic features [facility]) Discharge Diagnoses: Patient Active Problem List   Diagnosis Date Noted  . Major depressive disorder, recurrent severe without psychotic features (HCC) [F33.2] 04/08/2017    Past Psychiatric History: MDD, SI  Past Medical History:  Past Medical History:  Diagnosis Date  . Depression   . Hepatitis B    History reviewed. No pertinent surgical history. Family History:  Family History  Problem Relation Age of Onset  . Hyperlipidemia Mother    Family Psychiatric  History: unknown- patient reports he is adpoted Social History:  History  Alcohol Use  . Yes    Comment: occasional     History  Drug Use No    Social History   Social History  . Marital status: Single    Spouse name: N/A  . Number of children: N/A  . Years of education: N/A   Social History Main Topics  . Smoking status: Current Every Day Smoker    Packs/day: 0.50    Last attempt to quit: 04/19/2012  . Smokeless tobacco: Never Used  . Alcohol use Yes     Comment: occasional  . Drug use: No  . Sexual activity: Not Asked   Other Topics Concern  . None   Social History Narrative  . None    Hospital Course:   Per Admission assessment- Barry Laymon Pendergraftis an 26 y.o.malewho was brought voluntarily to [facility] this morning as a Walk-In. Pt c/o SI with a plan and gestures of hanging himself. Pt sts he went to [facility] to buy rope to hang himself but stopped himself and called a friend instead. This was the friend who  brought him to [facility]. Pt denies HI, SHI and AVH. Pt has a hx of superficially cutting himself but sts he has not cut himself since "years ago." Pt sts he started cutting at approximately 26 yo. Pt sts he attempted to kill himself once about 10 years ago and was psychiatrically hospitalized following the attempt. Pt sts he was psychiatrically admitted to [facility] in 2006 and 2012. Pt sts he has had several recent stressors including the break-up of a romantic relationship, work stress and family stresses. Pt sts "I think it just hit me today" referring to the romantic break-up. Pt sts that at times, he has anger outbursts and may slam objects on a table but he sts he has never hurt anyone. Pt is not prescribed any psychiatric medications currently but sees Dr. Maryln Manuel. Pt has been seeing this therapist for many years.   Pt sts he lives in an apartment alone and works full-time as a Investment banker, operational. 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 Pt has been diagnosed with several chronic health conditions including Hepatitis B, Irregular Heartbeat and in 2016, Alcoholic Liver Failure. Pt sts he owns a gun but has had a friend secure it away from him for now. Pt denies any legal hx. Pt sts his mother, father and brother have been diagnosed with depression. Pt sts he sleeps about 5-6 hours per night.  Pt sts he has had decreased appetite over the last month and thinks he may have lost as much as 10 pounds. Pt denies any hx of abuse. {t's symptoms of depression including sadness, fatigue, decreased self esteem, tearfulness / crying spells, self isolation, lack of motivation for activities and pleasure, irritability, negative outlook, difficulty thinking &concentrating, feeling helpless and hopeless, sleep and eating disturbances. Pt sts he has symptoms of anxiety including intrusive thoughts, excessive worrying and restlessness. Pt sts he drinks alcohol about 5 times per week (1-3 beers each  time), smokes cannabis about once each month "socially" and smokes 1/2 - 1 pack of cigarettes daily. Pt sts he has not been intoxicated from alcohol in "a few months."    On evaluation 04/08/17: [name] is awake, alert and oriented. Patient validates the information that was provided in the HPI. States he is feeling better today. Reports his is interested in restarting medications (prozac).  Denies suicidal or homicidal ideation during this assessment . Denies auditory or visual hallucination and does not appear to be responding to internal stimuli. Reports his depression 8/10 today. Reports attending group session is helping with his symptoms. Support, encouragement and reassurance was provided.   Patient remained on the [facility] unit for 3 days and stabilized with medication and therapy. Patient was started on Prozac, Vistaril PRN, and Trazodone PRN. Patient showed improvement with improved affect, improved mood, improved sleep and appetite. He attended group and was interacting appropriately with staff and other patients. He denies any SI/HI/AVH and rates depression 1/10 and anxiety 1/10. He contracts for safety. He plans to follow up with his therapist and will go to PCP for medication management. He is provided with prescriptions for his medications upon discharge.    Physical Findings: AIMS: Facial and Oral Movements Muscles of Facial Expression: None, normal Lips and Perioral Area: None, normal Jaw: None, normal Tongue: None, normal,Extremity Movements Upper (arms, wrists, hands, fingers): None, normal Lower (legs, knees, ankles, toes): None, normal, Trunk Movements Neck, shoulders, hips: None, normal, Overall Severity Severity of abnormal movements (highest score from questions above): None, normal Incapacitation due to abnormal movements: None, normal Patient's awareness of abnormal movements (rate only patient's report): No Awareness, Dental Status Current problems with teeth  and/or dentures?: No Does patient usually wear dentures?: No  CIWA:  CIWA-Ar Total: 1 COWS:     Musculoskeletal: Strength & Muscle Tone: within normal limits Gait & Station: normal Patient leans: N/A  Psychiatric Specialty Exam: Physical Exam  Nursing note and vitals reviewed. Constitutional: He is oriented to person, place, and time. He appears well-developed and well-nourished.  Cardiovascular: Normal rate.   Respiratory: Effort normal.  Musculoskeletal: Normal range of motion.  Neurological: He is alert and oriented to person, place, and time.  Skin: Skin is warm.    Review of Systems  Constitutional: Negative.   HENT: Positive for hearing loss.   Eyes: Negative.   Respiratory: Negative.   Cardiovascular: Negative.   Gastrointestinal: Negative.   Genitourinary: Negative.   Musculoskeletal: Negative.   Skin: Negative.   Neurological: Negative.     Blood pressure 125/82, pulse 86, temperature 97.9 F (36.6 C), temperature source Oral, resp. rate 16, height  (1.575 m), weight 68.9 kg (152 lb), SpO2 100 %.Body mass index is 27.8 kg/m.  General Appearance: Casual  Eye Contact:  Good  Speech:  Clear and Coherent and Normal Rate  Volume:  Normal  Mood:  Euthymic  Affect:  Appropriate  Thought Process:  Coherent  and Descriptions of Associations: Intact  Orientation:  Full (Time, Place, and Person)  Thought Content:  WDL  Suicidal Thoughts:  No  Homicidal Thoughts:  No  Memory:  Immediate;   Good Recent;   Good Remote;   Good  Judgement:  Good  Insight:  Good  Psychomotor Activity:  Normal  Concentration:  Concentration: Good and Attention Span: Good  Recall:  Good  Fund of Knowledge:  Good  Language:  Good  Akathisia:  No  Handed:  Right  AIMS (if indicated):     Assets:  Financial Resources/Insurance Housing Social Support Transportation  ADL's:  Intact  Cognition:  WNL  Sleep:  Number of Hours: 6.5     Have you used any form of tobacco in the  last 30 days? (Cigarettes, Smokeless Tobacco, Cigars, and/or Pipes): Yes  Has this patient used any form of tobacco in the last 30 days? (Cigarettes, Smokeless Tobacco, Cigars, and/or Pipes) Yes, Yes, A prescription for an FDA-approved tobacco cessation medication was offered at discharge and the patient refused  Blood Alcohol level:  Lab Results  Component Value Date   ETH <10 04/08/2017   [facility]  07/13/2010    <5        LOWEST DETECTABLE LIMIT FOR SERUM ALCOHOL IS 5 mg/dL FOR MEDICAL PURPOSES ONLY    Metabolic Disorder Labs:  No results found for: HGBA1C, MPG No results found for: PROLACTIN No results found for: CHOL, TRIG, HDL, CHOLHDL, VLDL, LDLCALC  See Psychiatric Specialty Exam and Suicide Risk Assessment completed by Attending Physician prior to discharge.  Discharge destination:  Home  Is patient on multiple antipsychotic therapies at discharge:  No   Has Patient had three or more failed trials of antipsychotic monotherapy by history:  No  Recommended Plan for Multiple Antipsychotic Therapies: NA   Allergies as of 04/12/2017   No Known Allergies     Medication List    TAKE these medications     Indication  FLUoxetine 20 MG/5ML solution Commonly known as:  PROZAC Take 5 mLs (20 mg total) by mouth daily. For mood control  Indication:  Panic Disorder, mood stability   hydrOXYzine 25 MG tablet Commonly known as:  ATARAX/VISTARIL Take 1 tablet (25 mg total) by mouth 3 (three) times daily as needed for anxiety.  Indication:  Feeling Anxious   traZODone 50 MG tablet Commonly known as:  DESYREL Take 1 tablet (50 mg total) by mouth at bedtime as needed for sleep.  Indication:  Trouble Sleeping      Follow-up Information    [facility] Follow up on 04/13/2017.   Specialty:  Family Medicine Why:  Hospital discharge follow up w Dr Kevan Ny on 10/3 at 3 PM.  Please call to cancel/reschedule if needed.  Contact information: [address] [place] [address] [phone]        Maryln Manuel, PhD Follow up on 04/18/2017.   Specialty:  Psychology Why:  Therapy appointment on 10/8 at 9 AM.  Please call to cancel/reschedule if needed.  Contact information: [address] [place] [address] [phone]           Follow-up recommendations:  Continue activity as tolerated. Continue diet as recommended by your PCP. Ensure to keep all appointments with outpatient providers.  Comments:  Patient is instructed prior to discharge to: Take all medications as prescribed by his/her mental healthcare provider. Report any adverse effects and or reactions from the medicines to his/her outpatient provider  promptly. Patient has been instructed & cautioned: To not engage in alcohol and or illegal drug use while on prescription medicines. In the event of worsening symptoms, patient is instructed to call the crisis hotline, 911 and or go to the nearest ED for appropriate evaluation and treatment of symptoms. To follow-up with his/her primary care provider for your other medical issues, concerns and or health care needs.    Signed: Gerlene Burdock Money, FNP 04/12/2017, 8:55 AM   Patient seen, Suicide Assessment Completed.  Disposition Plan Reviewed

## 2017-04-12 NOTE — [facility] Suicide Risk Assessment (Signed)
[facility] Discharge Suicide Risk Assessment   Principal Problem: Major depressive disorder, recurrent severe without psychotic features [facility]) Discharge Diagnoses:  Patient Active Problem List   Diagnosis Date Noted  . Major depressive disorder, recurrent severe without psychotic features (HCC) [F33.2] 04/08/2017    Total Time spent with patient: 30 minutes  Musculoskeletal: Strength & Muscle Tone: within normal limits Gait & Station: normal Patient leans: N/A  Psychiatric Specialty Exam: ROS denies headache, no chest pain, no dyspnea, no rash   Blood pressure 125/82, pulse 86, temperature 97.9 F (36.6 C), temperature source Oral, resp. rate 16, height  (1.575 m), weight 68.9 kg (152 lb), SpO2 100 %.Body mass index is 27.8 kg/m.  General Appearance: Well Groomed  Eye Contact::  Good  Speech:  Normal Rate409  Volume:  Normal  Mood:  reports mood is improved and denies feeling depressed   Affect:  Appropriate and Full Range  Thought Process:  Linear and Descriptions of Associations: Intact  Orientation:  Full (Time, Place, and Person)  Thought Content:  denies hallucinations, no delusions, not internally preoccupied   Suicidal Thoughts:  No denies suicidal or self injurious ideations, denies any violent or homicidal ideations  Homicidal Thoughts:  No  Memory:  recent and remote grossly intact   Judgement:  Other:  improving   Insight:  improving   Psychomotor Activity:  Normal  Concentration:  Good  Recall:  Good  Fund of Knowledge:Good  Language: Good  Akathisia:  Negative  Handed:  Right  AIMS (if indicated):     Assets:  Communication Skills Desire for Improvement Resilience  Sleep:  Number of Hours: 6.5  Cognition: WNL  ADL's:  Intact   Mental Status Per Nursing Assessment::   On Admission:     Demographic Factors:  26 year old male , single, employed , lives alone   Loss Factors: Recent break up   Historical Factors: History of self cutting, states he  has never attempted suicide in the past, denies history of mania, history of prior psychiatric admissions for depression  Risk Reduction Factors:   Sense of responsibility to family, Employed and Positive coping skills or problem solving skills  Continued Clinical Symptoms:  At this time patient is alert, attentive, well related, mood is described as improved and presents euthymic today, affect is appropriate and reactive, no thought disorder, no suicidal or self injurious ideations, no psychotic symptoms , future oriented , plans to return to work later this week. Denies medication side effects at this time Behavior on unit in good control, pleasant on approach    Cognitive Features That Contribute To Risk:  No gross cognitive deficits noted upon discharge. Is alert , attentive, and oriented x 3   Suicide Risk:  Mild:  Suicidal ideation of limited frequency, intensity, duration, and specificity.  There are no identifiable plans, no associated intent, mild dysphoria and related symptoms, good self-control (both objective and subjective assessment), few other risk factors, and identifiable protective factors, including available and accessible social support.  Follow-up Information    [facility] Follow up on 04/13/2017.   Specialty:  Family Medicine Why:  Hospital discharge follow up w Dr Kevan Ny on 10/3 at 3 PM.  Please call to cancel/reschedule if needed.  Contact information: [address] [place] [address] [phone]        Maryln Manuel, PhD Follow up on 04/18/2017.   Specialty:  Psychology Why:  Therapy appointment on 10/8 at 9 AM.  Please call to cancel/reschedule if needed.  Contact information: [address] [place] [address] [phone]           Plan Of Care/Follow-up recommendations:  Activity:  as tolerated Diet:  Regular  Tests:  NA Other:  See below   Patient is expressing readiness for  discharge,leaving in good spirits  Plans to return home Follow up as above   Craige Cotta, MD 04/12/2017, 9:59 AM

## 2017-04-12 NOTE — Progress Notes (Signed)
  [facility] Adult Case Management Discharge Plan :  Will you be returning to the same living situation after discharge:  Yes,  Pt returning home At discharge, do you have transportation home?: Yes,  Pt family to pick up Do you have the ability to pay for your medications: Yes,  Pt provided with prescriptions  Release of information consent forms completed and in the chart;  Patient's signature needed at discharge.  Patient to Follow up at: Follow-up Information    [facility] Follow up on 04/13/2017.   Specialty:  Family Medicine Why:  Hospital discharge follow up w Dr Kevan Ny on 10/3 at 3 PM.  Please call to cancel/reschedule if needed.  Contact information: [address] [place] [address] [phone]        Maryln Manuel, PhD Follow up on 04/18/2017.   Specialty:  Psychology Why:  Therapy appointment on 10/8 at 9 AM.  Please call to cancel/reschedule if needed.  Contact information: [address] [place] [address] [phone]           Next level of care provider has access to [facility] Link:no  Safety Planning and Suicide Prevention discussed: Yes,  with Pt; unsuccessful attempts made with father  Have you used any form of tobacco in the last 30 days? (Cigarettes, Smokeless Tobacco, Cigars, and/or Pipes): Yes  Has patient been referred to the Quitline?: Patient refused referral  Patient has been referred for addiction treatment: Yes  Verdene Lennert, LCSW 04/12/2017, 10:09 AM

## 2017-04-12 NOTE — [facility] Suicide Risk Assessment (Signed)
[facility] INPATIENT:  Family/Significant Other Suicide Prevention Education  Suicide Prevention Education:  Education Completed; Notnamed Scholz, Pt's father [phone], has been identified by the patient as the family member/significant other with whom the patient will be residing, and identified as the person(s) who will aid the patient in the event of a mental health crisis (suicidal ideations/suicide attempt).  With written consent from the patient, the family member/significant other has been provided the following suicide prevention education, prior to the and/or following the discharge of the patient.  The suicide prevention education provided includes the following:  Suicide risk factors  Suicide prevention and interventions  National Suicide Hotline telephone number  [facility] assessment telephone number  [facility] Emergency Assistance 911  [facility] and/or Residential Mobile Crisis Unit telephone number  Request made of family/significant other to:  Remove weapons (e.g., guns, rifles, knives), all items previously/currently identified as safety concern.    Remove drugs/medications (over-the-counter, prescriptions, illicit drugs), all items previously/currently identified as a safety concern.  The family member/significant other verbalizes understanding of the suicide prevention education information provided.  The family member/significant other agrees to remove the items of safety concern listed above.  Pt's father expressed concern as he feels that the patient is not being as forthcoming with his depression. However, he expresses support of Pt at discharge. He also agrees to work with Pt to remove firearm from the home.   Verdene Lennert 04/12/2017, 2:31 PM

## 2017-04-12 NOTE — [facility] Suicide Risk Assessment (Signed)
CSW attempted for a third time to contact Pt's father. CSW left HIPPA compliant message requesting a return call.   Vernie Shanks, LCSW Clinical Social Work [phone]

## 2017-04-12 NOTE — Progress Notes (Signed)
Pt discharged home with his friend. Pt was ambulatory, stable and appreciative at that time. All papers and prescriptions were given and valuables returned. Verbal understanding expressed. Denies SI/HI and A/VH. Pt given opportunity to express concerns and ask questions.

## 2018-06-23 ENCOUNTER — Other Ambulatory Visit: Payer: Self-pay

## 2018-06-23 ENCOUNTER — Emergency Department (HOSPITAL_COMMUNITY): Payer: 59

## 2018-06-23 ENCOUNTER — Emergency Department (HOSPITAL_COMMUNITY)
Admission: EM | Admit: 2018-06-23 | Discharge: 2018-06-23 | Disposition: A | Discharge status: Home / Self Care | Payer: 59 | Attending: Emergency Medicine | Admitting: Emergency Medicine

## 2018-06-23 ENCOUNTER — Encounter (HOSPITAL_COMMUNITY): Payer: Self-pay | Admitting: Emergency Medicine

## 2018-06-23 DIAGNOSIS — F17200 Nicotine dependence, unspecified, uncomplicated: Secondary | ICD-10-CM | POA: Diagnosis not present

## 2018-06-23 DIAGNOSIS — R109 Unspecified abdominal pain: Secondary | ICD-10-CM

## 2018-06-23 DIAGNOSIS — N200 Calculus of kidney: Secondary | ICD-10-CM | POA: Insufficient documentation

## 2018-06-23 DIAGNOSIS — R1031 Right lower quadrant pain: Secondary | ICD-10-CM | POA: Diagnosis present

## 2018-06-23 LAB — BASIC METABOLIC PANEL
ANION GAP: 11 (ref 5–15)
BUN: 21 mg/dL — AB (ref 6–20)
CHLORIDE: 104 mmol/L (ref 98–111)
CO2: 26 mmol/L (ref 22–32)
Calcium: 9.4 mg/dL (ref 8.9–10.3)
Creatinine, Ser: 0.93 mg/dL (ref 0.61–1.24)
GFR calc Af Amer: >60 mL/min (ref >60)
GFR calc non Af Amer: >60 mL/min (ref >60)
Glucose, Bld: 142 mg/dL — ABNORMAL HIGH (ref 70–99)
POTASSIUM: 3.7 mmol/L (ref 3.5–5.1)
SODIUM: 141 mmol/L (ref 135–145)

## 2018-06-23 LAB — CBC
HEMATOCRIT: 47.3 % (ref 39.0–52.0)
HEMOGLOBIN: 16 g/dL (ref 13.0–17.0)
MCH: 28.8 pg (ref 26.0–34.0)
MCHC: 33.8 g/dL (ref 30.0–36.0)
MCV: 85.2 fL (ref 80.0–100.0)
Platelets: 215 10*3/uL (ref 150–400)
RBC: 5.55 MIL/uL (ref 4.22–5.81)
RDW: 12.6 % (ref 11.5–15.5)
WBC: 8.5 10*3/uL (ref 4.0–10.5)
nRBC: 0 % (ref 0.0–0.2)

## 2018-06-23 LAB — URINALYSIS, ROUTINE W REFLEX MICROSCOPIC
Bilirubin Urine: NEGATIVE
GLUCOSE, UA: NEGATIVE mg/dL
Ketones, ur: NEGATIVE mg/dL
Leukocytes, UA: NEGATIVE
Nitrite: NEGATIVE
Protein, ur: 30 mg/dL — AB
RBC / HPF: >50 RBC/hpf — ABNORMAL HIGH (ref 0–5)
SPECIFIC GRAVITY, URINE: 1.026 (ref 1.005–1.030)
pH: 7 (ref 5.0–8.0)

## 2018-06-23 LAB — LIPASE, BLOOD: LIPASE: 25 U/L (ref 11–51)

## 2018-06-23 LAB — I-STAT CG4 LACTIC ACID, ED: LACTIC ACID, VENOUS: 1.58 mmol/L (ref 0.5–1.9)

## 2018-06-23 MED ORDER — ONDANSETRON HCL 4 MG/2ML IJ SOLN
4.0000 mg | Freq: Once | INTRAMUSCULAR | Status: AC
  Administered 2018-06-23: 4 mg via INTRAVENOUS
  Filled 2018-06-23: qty 2

## 2018-06-23 MED ORDER — HYDROMORPHONE HCL 1 MG/ML IJ SOLN
0.5000 mg | Freq: Once | INTRAMUSCULAR | Status: AC
  Administered 2018-06-23: 0.5 mg via INTRAVENOUS
  Filled 2018-06-23: qty 1

## 2018-06-23 MED ORDER — TAMSULOSIN HCL 0.4 MG PO CAPS: 0.4000 mg | ORAL_CAPSULE | Freq: Every day | ORAL | 0 refills | Status: DC

## 2018-06-23 MED ORDER — ONDANSETRON 4 MG PO TBDP: ORAL_TABLET | ORAL | 0 refills | Status: DC

## 2018-06-23 MED ORDER — OXYCODONE-ACETAMINOPHEN 5-325 MG PO TABS: 1.0000 | ORAL_TABLET | Freq: Four times a day (QID) | ORAL | 0 refills | Status: DC | PRN

## 2018-06-23 MED ORDER — KETOROLAC TROMETHAMINE 30 MG/ML IJ SOLN
30.0000 mg | Freq: Once | INTRAMUSCULAR | Status: AC
Start: 2018-06-23 — End: 2018-06-23
  Administered 2018-06-23: 30 mg via INTRAVENOUS
  Filled 2018-06-23: qty 1

## 2018-06-23 NOTE — ED Notes (Signed)
Pt called x2 for triage. Pt in bathroom again.

## 2018-06-23 NOTE — ED Provider Notes (Signed)
Care assumed from [facility] of still at shift change, please see her note for full details, but in brief [name] is a 27 y.o. male presents with sudden onset flank pain on the right an hour prior to arrival with associated nausea and vomiting.  Presentation concerning for kidney stones, no prior history of the same, normal renal function, hematuria but no signs of infection on urinalysis, awaiting CT scan at shift change.  Plan: Follow-up on CT scan and reevaluate patient's pain.  Ct Renal Stone Study  Result Date: 06/23/2018 CLINICAL DATA:  27 year old male with right flank pain. Concern for stone. EXAM: CT ABDOMEN AND PELVIS WITHOUT CONTRAST TECHNIQUE: Multidetector CT imaging of the abdomen and pelvis was performed following the standard protocol without IV contrast. COMPARISON:  None. FINDINGS: Evaluation of this exam is limited in the absence of intravenous contrast. Lower chest: The visualized lung bases are clear. No intra-abdominal free air or free fluid. Hepatobiliary: Probable mild fatty infiltration of the liver. No intrahepatic biliary ductal dilatation. The gallbladder is unremarkable. Pancreas: Unremarkable. No pancreatic ductal dilatation or surrounding inflammatory changes. Spleen: Normal in size without focal abnormality. Adrenals/Urinary Tract: The adrenal glands are unremarkable. There is a 1 cm nonobstructing stone in the interpolar aspect of the left kidney posteriorly. No hydronephrosis. There is minimal fullness of the right renal collecting system. No stone identified within the right kidney. There is a 3 mm stone along the posterior wall of the urinary bladder adjacent to the right UVJ most likely representing a recently passed right renal calculus versus less likely a right UVJ stone. The urinary bladder is otherwise unremarkable. Stomach/Bowel: There is no bowel obstruction or active inflammation. Multiple normal caliber fecalized loops of distal small bowel may represent  increased transit time or small intestinal bacterial overgrowth. Normal appendix. Vascular/Lymphatic: The abdominal aorta and IVC are grossly unremarkable on this noncontrast CT. No portal venous gas. There is no adenopathy. Reproductive: The prostate and seminal vesicles are grossly unremarkable. No pelvic mass. Other: None Musculoskeletal: No acute or significant osseous findings. IMPRESSION: 1. A 3 mm recently passed right renal calculus versus a right UVJ stone. Minimal fullness of the right renal collecting system. 2. A 1 cm nonobstructing left renal calculus.  No hydronephrosis. 3. No bowel obstruction or active inflammation. Normal appendix. Electronically Signed   By: Elgie Collard M.D.   On: 06/23/2018 06:30   CT shows a 3 mm stone that appears to have just recently passed from the right UVJ into the bladder which easily explains patient's symptoms there is some minimal fullness within the right renal collecting system.  There is also a 1 cm nonobstructing stone within the left kidney with no signs of hydronephrosis, CT is otherwise unremarkable.  On reevaluation patient reports significant improvement in his pain he is now tolerating p.o. fluids.  At this time I feel he is stable for discharge home, discussed that his stone has already moved into the bladder and will likely pass in the next few hours, will treat with nausea medication, pain medication and Flomax and have patient follow-up with urology regarding the 1 cm nonobstructing stone on the left.  Return precautions have been discussed and patient expresses understanding and is in agreement with plan.  At this time he is stable for discharge home in good condition.  Final diagnoses:  Kidney stone  Right flank pain   ED Discharge Orders         Ordered    ondansetron (ZOFRAN ODT)  4 MG disintegrating tablet     06/23/18 0645    oxyCODONE-acetaminophen (PERCOCET) 5-325 MG tablet  Every 6 hours PRN     06/23/18 0645    tamsulosin  (FLOMAX) 0.4 MG CAPS capsule  Daily     06/23/18 0645            Dartha LodgeFord, Teela Narducci N, PA-C 06/23/18 [id number]    Derwood KaplanNanavati, Ankit, MD 06/23/18 [phone]

## 2018-06-23 NOTE — ED Notes (Signed)
Pt called x1 for triage. Pt in bathroom

## 2018-06-23 NOTE — ED Triage Notes (Signed)
Pt arriving with right flank pain that began 1 hour ago and nausea. Pt actively vomiting. No history of kidney stones.

## 2018-06-23 NOTE — ED Provider Notes (Signed)
[facility]-EMERGENCY DEPT Provider Note   CSN: [id number] Arrival date & time: 06/23/18  [id number]     History   Chief Complaint Chief Complaint  Patient presents with  . Flank Pain    HPI [name] is a 27 y.o. male.  Patient to ED with sudden onset right flank pain that radiates to the RLQ abdomen and groin that started around 2:30 this morning. He is experiencing nausea and vomiting. No hematuria, fever. No history of kidney stones.   The history is provided by the patient. No language interpreter was used.  Flank Pain  Pertinent negatives include no chest pain and no shortness of breath.    Past Medical History:  Diagnosis Date  . Depression   . Hepatitis B     Patient Active Problem List   Diagnosis Date Noted  . Major depressive disorder, recurrent severe without psychotic features (HCC) 04/08/2017    History reviewed. No pertinent surgical history.      Home Medications    Prior to Admission medications   Medication Sig Start Date End Date Taking? Authorizing Provider  hydrOXYzine (ATARAX/VISTARIL) 25 MG tablet Take 1 tablet (25 mg total) by mouth 3 (three) times daily as needed for anxiety. 04/12/17  Yes Money, Gerlene Burdockravis B, FNP  loratadine (CLARITIN) 10 MG tablet Take 10 mg by mouth daily as needed for allergies.   Yes [provider]  FLUoxetine (PROZAC) 20 MG/5ML solution Take 5 mLs (20 mg total) by mouth daily. For mood control Patient not taking: Reported on 06/23/2018 04/13/17   Money, Gerlene Burdockravis B, FNP  traZODone (DESYREL) 50 MG tablet Take 1 tablet (50 mg total) by mouth at bedtime as needed for sleep. Patient not taking: Reported on 06/23/2018 04/12/17   Money, Gerlene Burdockravis B, FNP    Family History Family History  Problem Relation Age of Onset  . Hyperlipidemia Mother     Social History Social History   Tobacco Use  . Smoking status: Current Every Day Smoker    Packs/day: 0.50    Last attempt to quit: 04/19/2012   Years since quitting: 6.1  . Smokeless tobacco: Never Used  Substance Use Topics  . Alcohol use: Yes    Comment: occasional  . Drug use: No     Allergies   Patient has no known allergies.   Review of Systems Review of Systems  Constitutional: Negative for diaphoresis and fever.  Respiratory: Negative for shortness of breath.   Cardiovascular: Negative for chest pain.  Gastrointestinal: Positive for nausea and vomiting.  Genitourinary: Positive for flank pain.  Neurological: Negative for syncope and weakness.     Physical Exam Updated Vital Signs BP 98/80 (BP Location: Right Arm)   Pulse (!) 107   Resp 20   Ht 5\' 2"  (1.575 m)   Wt 68 kg   SpO2 98%   BMI 27.44 kg/m   Physical Exam Constitutional:      Appearance: He is well-developed.  Neck:     Musculoskeletal: Normal range of motion.  Cardiovascular:     Rate and Rhythm: Regular rhythm. Tachycardia present.  Pulmonary:     Effort: Pulmonary effort is normal.  Abdominal:     Comments: Diffuse right lower abdominal tenderness. No guarding or rebound. Abdomen soft.   Genitourinary:    Penis: Normal.      Comments: Right CVA tenderness.  Musculoskeletal: Normal range of motion.  Skin:    General: Skin is warm and dry.  Neurological:  Mental Status: He is alert and oriented to person, place, and time.      ED Treatments / Results  Labs (all labs ordered are listed, but only abnormal results are displayed) Labs Reviewed  URINE CULTURE  CBC  URINALYSIS, ROUTINE W REFLEX MICROSCOPIC  BASIC METABOLIC PANEL  LIPASE, BLOOD  I-STAT CG4 LACTIC ACID, ED    EKG None  Radiology No results found.  Procedures Procedures (including critical care time)  Medications Ordered in ED Medications  ketorolac (TORADOL) 30 MG/ML injection 30 mg (has no administration in time range)  HYDROmorphone (DILAUDID) injection 0.5 mg (has no administration in time range)  ondansetron (ZOFRAN) injection 4 mg (has no  administration in time range)     Initial Impression / Assessment and Plan / ED Course  I have reviewed the triage vital signs and the nursing notes.  Pertinent labs & imaging results that were available during my care of the patient were reviewed by me and considered in my medical decision making (see chart for details).     Patient with sudden onset sharp, intense right flank pain that radiates to RLQ abdomen and is associated with N, V. Highly suspicious for kidney stone. UA, CT renal pending.  Patient care signed out to [facility], PA-C, pending result of CT scan and re-evaluation of pain.  Final Clinical Impressions(s) / ED Diagnoses   Final diagnoses:  None   Right flank pain  ED Discharge Orders    None       Elpidio Anis, PA-C 06/26/18 0549    Derwood Kaplan, MD 06/30/18 [phone]

## 2018-06-23 NOTE — ED Notes (Signed)
Bed: WA10 Expected date:  Expected time:  Means of arrival:  Comments: 

## 2018-06-23 NOTE — ED Notes (Signed)
Pt transported to CT ?

## 2018-06-23 NOTE — Discharge Instructions (Signed)
You have a kidney stone on the right that is just passed into your bladder from the ureter, it should pass at some point during the day today, please use ibuprofen every 6 hours, pain medication as needed for breakthrough pain, nausea medicine, and take Flomax once daily to help stone pass.  Scan also showed a 1 cm kidney stone within the left kidney that is not causing any obstruction at this time, please follow-up with urology regarding this.  Return to the emergency department if you have worsening flank pain, fevers, are unable to urinate have persistent vomiting or any other new or concerning symptoms occur.

## 2018-06-24 LAB — URINE CULTURE
Culture: NO GROWTH
SPECIAL REQUESTS: NORMAL

## 2018-07-17 DIAGNOSIS — F4310 Post-traumatic stress disorder, unspecified: Secondary | ICD-10-CM | POA: Diagnosis not present

## 2018-07-24 DIAGNOSIS — F4310 Post-traumatic stress disorder, unspecified: Secondary | ICD-10-CM | POA: Diagnosis not present

## 2018-08-01 DIAGNOSIS — N200 Calculus of kidney: Secondary | ICD-10-CM | POA: Diagnosis not present

## 2018-08-07 DIAGNOSIS — F4310 Post-traumatic stress disorder, unspecified: Secondary | ICD-10-CM | POA: Diagnosis not present

## 2018-08-28 DIAGNOSIS — F4310 Post-traumatic stress disorder, unspecified: Secondary | ICD-10-CM | POA: Diagnosis not present

## 2018-08-30 ENCOUNTER — Ambulatory Visit: Payer: Self-pay | Admitting: Psychiatry

## 2018-09-04 DIAGNOSIS — F4310 Post-traumatic stress disorder, unspecified: Secondary | ICD-10-CM | POA: Diagnosis not present

## 2018-09-06 ENCOUNTER — Ambulatory Visit (INDEPENDENT_AMBULATORY_CARE_PROVIDER_SITE_OTHER): Payer: BLUE CROSS/BLUE SHIELD | Admitting: Psychiatry

## 2018-09-06 ENCOUNTER — Encounter: Payer: Self-pay | Admitting: Psychiatry

## 2018-09-06 VITALS — BP 113/67 | HR 66 | Ht 62.0 in | Wt 160.0 lb

## 2018-09-06 DIAGNOSIS — F32A Depression, unspecified: Secondary | ICD-10-CM

## 2018-09-06 DIAGNOSIS — F329 Major depressive disorder, single episode, unspecified: Secondary | ICD-10-CM

## 2018-09-06 MED ORDER — FLUOXETINE HCL 20 MG/5ML PO SOLN
ORAL | 1 refills | Status: DC
Start: 2018-09-06 — End: 2018-11-04

## 2018-09-06 NOTE — Progress Notes (Signed)
[facility] MD/PA/NP Initial Note  09/07/2018 1:12 PM [name]  MRN:  [id number]  Chief Complaint:  Chief Complaint    Anxiety; Depression      HPI: Pt is a 28 yo male seen to re-establish treatment. He was previously seen in this office by Anne Fu, PA. Pt  reports that he would like to re-start Prozac, possibly at a lower dose, since he felt as if his heart was racing and he was eating less on higher doses in the past and lower doses have been effective. Pt reports that he has taken Prozac intermittently for the last decade. He reports that he was re-started on Prozac after hospitalization in October for depression with SI and took Prozac until November.   He describes mood as "ok." Reports depression "here and there... feeling down every now and then." occasional irritability. Denies elevated moods. He reports sleep has been "ok" and lately sleep has been good. Reports sleeping 6-7 hours most nights and occasionally 8 hours. Reports appetite has been "ok" and that appetite and food intake fluctuates. Reports that wt has been stable. Denies low energy. Motivation is adequate. Denies impaired concentration unless he did not sleep well. Denies SI. Reports one incident of passive death wishes in the last 3 months.  Reports h/o sudden onset of severe depressive s/s with SI and that this occurred in October when he was hospitalized after he told his girlfriend that he was at a store shopping for rope to hang himself. Denies any h/o suicide attempts. Reports h/o SIB in his teens and denies any recent SIB or self-injurious ideation.   He reports that he has anxiety that is mostly situational. He reports having trouble breathing and concentrating, heart beats fast, and thoughts will race. He reports that these episodes are infrequent and approximately once a month. Reports "a mild constant anxiety that is always there," such as some worry and doubt about his job performance "even though I know  I am doing everything right." Recalls anxiety since middle school with constant worry and a few panic attacks.   Reports that he will worry about leaving things on at home and sometimes has to return home to check to see if things are turned. Reports some checking behaviors about once a week. He repots that he likes things to be in a certain way and if things are disorganized it negatively affects his mood. Denies obsessive counting or grouping. Denies rituals and routines. Denies any intrusive thoughts.  Reports that he used to have some social anxiety, particularly when he was younger and drinking ETOH. Reports that social anxiety is rare now and usually only in a crowded mall.    Reports history in the past of some manic s/s and denies any manic or hypomanic s/s in several years. Reports periods of decreased need for sleep in the past where he would sleep 2-3 hours in years past. Reports excessive spending and impulsive behaviors in years past. Denies periods of excessive talkativeness and reports that he is talkative at baseline. Reports periods of excessive energy in the past and increased goal directed activity (working long-hours and then going out). Does not recall racing thoughts.   Denies AH or VH. Denies delusions. Reports "sometimes I feel like the world is working against me" such as "bad luck." Denies thinking that a specific person is out to get him or working against him.    Born [place] and raised in [place]. Has an older brother (6 yrs  older) and an older sister (65 years old) who both live in [place]. He was adopted at 62 months old. He was the only sibling that was adoptive. Reports that he had a "rocky relationship" with parents in HS and had first psych hospitalization for depression and SI. Completed HS and has had 2 semesters at [employer]. 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 Works 3 pm-9 pm. Lives with girlfriend of almost one year and reports that she is  supportive and this has had a positive effect on his mood. Never been married. No children. Parents are also supportive and that their relationship is better now than it has ever been. Parents live locally. Enjoys playing music, drawing, cooking, and eating.  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   Past Psychiatric Medication Trials: Zyprexa- Prescribed and reports that he did not take Prozac- effective. Reports side effects at higher dose. Reports that it has been effective for mood and anxiety.  Hydroxyzine- effective. Causes some drowsiness.  Trazodone- took during hospitalization. Effective.  Visit Diagnosis:    ICD-10-CM   1. Depression, unspecified depression type F32.9 FLUoxetine (PROZAC) 20 MG/5ML solution    Past Psychiatric History: Reports 3 past psychiatric hospitalizations, with most recent psychiatric hospitalization in October 2019 for SI. Also hospitalized in 2008 and 2012. He has seen Anne Fu, [place] in this office in the past.  Currently seeing a therapist regularly and he reports that this is helpful.   Past Medical History:  Past Medical History:  Diagnosis Date  . Depression   . Hepatitis B     Past Surgical History:  Procedure Laterality Date  . CLEFT LIP REPAIR    . MANDIBLE SURGERY       Family History:  Family History  Adopted: Yes  Family history unknown: Yes    Social History:  Social History   Socioeconomic History  . Marital status: Single    Spouse name: Not on file  . Number of children: Not on file  . Years of education: Not on file  . Highest education level: Not on file  Occupational History  . Not on file  Social Needs  . Financial resource strain: Not on file  . Food insecurity:    Worry: Not on file    Inability: Not on file  . Transportation needs:    Medical: Not on file    Non-medical: Not on file  Tobacco Use  . Smoking status: Current Every Day Smoker    Packs/day: 0.50    Last attempt to  quit: 04/19/2012    Years since quitting: 6.3  . Smokeless tobacco: Never Used  Substance and Sexual Activity  . 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 Drug use: No  . Sexual activity: Not on file  Lifestyle  . Physical activity:    Days per week: Not on file    Minutes per session: Not on file  . Stress: Not on file  Relationships  . Social connections:    Talks on phone: Not on file    Gets together: Not on file    Attends religious service: Not on file    Active member of club or organization: Not on file    Attends meetings of clubs or organizations: Not on file    Relationship status: Not on file  Other Topics Concern  . Not on file  Social History Narrative  .  Not on file    Allergies: No Known Allergies  Metabolic Disorder Labs: No results found for: HGBA1C, MPG No results found for: PROLACTIN No results found for: CHOL, TRIG, HDL, CHOLHDL, VLDL, LDLCALC   Therapeutic Level Labs: No results found for: LITHIUM No results found for: VALPROATE No components found for:  CBMZ  Current Medications: Current Outpatient Medications  Medication Sig Dispense Refill  . hydrOXYzine (ATARAX/VISTARIL) 25 MG tablet Take 1 tablet (25 mg total) by mouth 3 (three) times daily as needed for anxiety. 30 tablet 0  . loratadine (CLARITIN) 10 MG tablet Take 10 mg by mouth daily as needed for allergies.    Marland Kitchen FLUoxetine (PROZAC) 20 MG/5ML solution Take 2.5 ml po qd x 5-7 days, then increase to 5 ml (20 mg) po qd 120 mL 1   No current facility-administered medications for this visit.     Medication Side Effects: none  Orders placed this visit:  No orders of the defined types were placed in this encounter.   Psychiatric Specialty Exam:  ROS  Blood pressure 113/67, pulse 66, height 5\' 2"  (1.575 m), weight 160 lb (72.6 kg).Body mass index is 29.26 kg/m.  General Appearance: Casual  Eye Contact:  Good  Speech:  Clear and Coherent and Normal Rate   Volume:  Normal  Mood:  Anxious  Affect:  Constricted  Thought Process:  Coherent  Orientation:  Full (Time, Place, and Person)  Thought Content: Logical   Suicidal Thoughts:  No  Homicidal Thoughts:  No  Memory:  WNL  Judgement:  Good  Insight:  Good  Psychomotor Activity:  Normal  Concentration:  Concentration: Good  Recall:  Good  Fund of Knowledge: Good  Language: Good  Assets:  Communication Skills Desire for Improvement Social Support  ADL's:  Intact  Cognition: WNL  Prognosis:  Good   Screenings:  AIMS     Admission (Discharged) [facility]  AIMS Total Score  0    AUDIT     Admission (Discharged) from [facility]  Alcohol Use Disorder Identification Test Final Score (AUDIT)  4      Receiving Psychotherapy: Yes   Treatment Plan/Recommendations: Pt seen for 45 minutes and greater than 50% of visit spent counseling pt and reviewing past inpatient and outpatient records during visit and reviewed past medication trials and response to tx with pt. Discussed that Prozac has been the most effective tx for him and has been well tolerated. Discussed re-starting Prozac oral solution for mood and anxiety. Discussed starting with 10 mg po qd for one week and then increasing to 20 mg po qd since pt reports that he has had some side effects with the start of Prozac 20 mg in the past and starting with lower dose may improve tolerability. Will continue hydroxyzine prn anxiety and insomnia. Patient advised to contact office with any questions, adverse effects, or acute worsening in signs and symptoms.     Corie Chiquito, PMHNP

## 2018-09-25 DIAGNOSIS — F4310 Post-traumatic stress disorder, unspecified: Secondary | ICD-10-CM | POA: Diagnosis not present

## 2018-10-16 DIAGNOSIS — F4310 Post-traumatic stress disorder, unspecified: Secondary | ICD-10-CM | POA: Diagnosis not present

## 2018-10-30 DIAGNOSIS — F4310 Post-traumatic stress disorder, unspecified: Secondary | ICD-10-CM | POA: Diagnosis not present

## 2018-10-31 ENCOUNTER — Ambulatory Visit (INDEPENDENT_AMBULATORY_CARE_PROVIDER_SITE_OTHER): Payer: 59 | Admitting: Psychiatry

## 2018-10-31 ENCOUNTER — Other Ambulatory Visit: Payer: Self-pay

## 2018-10-31 DIAGNOSIS — F32A Depression, unspecified: Secondary | ICD-10-CM

## 2018-10-31 DIAGNOSIS — F329 Major depressive disorder, single episode, unspecified: Secondary | ICD-10-CM

## 2018-10-31 MED ORDER — ARIPIPRAZOLE 1 MG/ML PO SOLN: 2.0000 mg | Freq: Every day | ORAL | 0 refills | Status: DC

## 2018-10-31 NOTE — Progress Notes (Signed)
[name] [id number] 08-20-1990 28 y.o.  Subjective:   Patient ID:  [name] is a 28 y.o. (DOB 14-Jan-1991) male.  Chief Complaint:  Chief Complaint  Patient presents with  . Anxiety  . Depression   Virtual Visit via Video Note  I connected with Loni Abdon Dibert on 10/31/18 at  9:30 AM EDT by a video enabled telemedicine application and verified that I am speaking with the correct person using two identifiers.   I discussed the limitations of evaluation and management by telemedicine and the availability of in person appointments. The patient expressed understanding and agreed to proceed.I discussed the assessment and treatment plan with the patient. The patient was provided an opportunity to ask questions and all were answered. The patient agreed with the plan and demonstrated an understanding of the instructions.   The patient was advised to call back or seek an in-person evaluation if the symptoms worsen or if the condition fails to improve as anticipated.  I provided 30 minutes of non-face-to-face time during this encounter. The pt was located at home. The provider was located at home.   Corie Chiquito, PMHNP    HPI [name] presents for follow-up of anxiety, depression, and history of insomnia and mania. He reports that he and his girlfriend were laid off about a month ago with the pandemic. He reports that he stopped taking Prozac about a week ago. "It made me feel really bad" with nausea, HA's, and weird dreams. He reports that he is now sleeping better and appetite has improved. He reports that he noticed Prozac had a positive effect on his mood. Reports that he had some discontinuation s/s for a couple of days after stopping Prozac. He reports that his anxiety has been "ok" aside from increase anxiety after lay off.   He reports that he was very sleepy when he first re-started Prozac. Reports that his girlfriend said he was screaming in his sleep.  Reports that he is sleeping 7-8 hours a night. Appetite has returned to baseline and was inconsistent while he was on Prozac. He reports that his energy and motivation have been good and he has been taking regular walks. Reports adequate concentration. Denies SI.   He reports that mood is "pretty good despite everything." Denies depressed or irritable mood.   Past Psychiatric Medication Trials: Zyprexa- Prescribed and reports that he did not take Prozac- effective. Reports side effects at higher dose. Reports that it has been effective for mood and anxiety.  Hydroxyzine- effective. Causes some drowsiness.  Trazodone- took during hospitalization. Effective.   Review of Systems:  Review of Systems  Gastrointestinal: Positive for constipation.  Musculoskeletal: Negative for gait problem.  Neurological: Negative for tremors.       Reports HA's when he stopped taking Prozac  Psychiatric/Behavioral:       Please refer to HPI    Medications: I have reviewed the patient's current medications.  Current Outpatient Medications  Medication Sig Dispense Refill  . Aripiprazole 1 MG/ML mL Take 2 mLs (2 mg total) by mouth daily for 30 days. 75 mL 0  . FLUoxetine (PROZAC) 20 MG/5ML solution Take 2.5 ml po qd x 5-7 days, then increase to 5 ml (20 mg) po qd 120 mL 1  . hydrOXYzine (ATARAX/VISTARIL) 25 MG tablet Take 1 tablet (25 mg total) by mouth 3 (three) times daily as needed for anxiety. 30 tablet 0  . loratadine (CLARITIN) 10 MG tablet Take 10 mg by mouth daily as  needed for allergies.     No current facility-administered medications for this visit.     Medication Side Effects: Other: Had multiple side effects with Prozac (see HPI)  Allergies: No Known Allergies  Past Medical History:  Diagnosis Date  . Depression   . Hepatitis B     Family History  Adopted: Yes  Family history unknown: Yes    Social History   Socioeconomic History  . Marital status: Single    Spouse name: Not  on file  . Number of children: Not on file  . Years of education: Not on file  . Highest education level: Not on file  Occupational History  . Not on file  Social Needs  . Financial resource strain: Not on file  . Food insecurity:    Worry: Not on file    Inability: Not on file  . Transportation needs:    Medical: Not on file    Non-medical: Not on file  Tobacco Use  . Smoking status: Current Every Day Smoker    Packs/day: 0.50    Last attempt to quit: 04/19/2012    Years since quitting: 6.5  . Smokeless tobacco: Never Used  Substance and Sexual Activity  . 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 Drug use: No  . Sexual activity: Not on file  Lifestyle  . Physical activity:    Days per week: Not on file    Minutes per session: Not on file  . Stress: Not on file  Relationships  . Social connections:    Talks on phone: Not on file    Gets together: Not on file    Attends religious service: Not on file    Active member of club or organization: Not on file    Attends meetings of clubs or organizations: Not on file    Relationship status: Not on file  . Intimate partner violence:    Fear of current or ex partner: Not on file    Emotionally abused: Not on file    Physically abused: Not on file    Forced sexual activity: Not on file  Other Topics Concern  . Not on file  Social History Narrative  . Not on file    Past Medical History, Surgical history, Social history, and Family history were reviewed and updated as appropriate.   Please see review of systems for further details on the patient's review from today.   Objective:   Physical Exam:  There were no vitals taken for this visit.  Physical Exam Neurological:     Mental Status: He is alert and oriented to person, place, and time.     Cranial Nerves: No dysarthria.  Psychiatric:        Attention and Perception: Attention normal.        Mood and Affect: Mood normal.        Speech:  Speech normal.        Behavior: Behavior is cooperative.        Thought Content: Thought content normal. Thought content is not paranoid or delusional. Thought content does not include homicidal or suicidal ideation. Thought content does not include homicidal or suicidal plan.        Cognition and Memory: Cognition and memory normal.        Judgment: Judgment normal.     Lab Review:     Component Value Date/Time   NA 141 06/23/2018 0506   K 3.7 06/23/2018  0506   CL 104 06/23/2018 0506   CO2 26 06/23/2018 0506   GLUCOSE 142 (H) 06/23/2018 0506   BUN 21 (H) 06/23/2018 0506   CREATININE 0.93 06/23/2018 0506   CALCIUM 9.4 06/23/2018 0506   PROT 8.2 (H) 04/08/2017 0922   ALBUMIN 4.7 04/08/2017 0922   AST 31 04/08/2017 0922   ALT 27 04/08/2017 0922   ALKPHOS 67 04/08/2017 0922   BILITOT 0.7 04/08/2017 0922   GFRNONAA >60 06/23/2018 0506   GFRAA >60 06/23/2018 0506       Component Value Date/Time   WBC 8.5 06/23/2018 0506   RBC 5.55 06/23/2018 0506   HGB 16.0 06/23/2018 0506   HCT 47.3 06/23/2018 0506   PLT 215 06/23/2018 0506   MCV 85.2 06/23/2018 0506   MCH 28.8 06/23/2018 0506   MCHC 33.8 06/23/2018 0506   RDW 12.6 06/23/2018 0506   LYMPHSABS 1.0 07/13/2010 0142   MONOABS 0.7 07/13/2010 0142   EOSABS 0.3 07/13/2010 0142   BASOSABS 0.1 07/13/2010 0142    No results found for: POCLITH, LITHIUM   No results found for: PHENYTOIN, PHENOBARB, VALPROATE, CBMZ   .res Assessment: Plan:   Discussed that effects of discontinuation of Prozac may not be fully apparent until a month after discontinuing Prozac.  Discussed option of awaiting response to not being on medication or starting a medication at this time to avoid recurrence of signs and symptoms.  Patient reports that he would prefer to start trial of medication at this time.  Discussed potential benefits, risk, and side effects of Abilify and that it could treat his depressive signs and symptoms and prevent recurrence of  mania. Discussed potential metabolic side effects associated with atypical antipsychotics, as well as potential risk for movement side effects. Advised pt to contact office if movement side effects occur.   Discussed starting low dose due to patient's history of medication sensitivity and that further titration may be needed..  Patient reports that he prefers oral solution if possible since he has difficulty swallowing pills.  Discussed that Abilify is available in an oral solution and that this would also allow for gradual titration.  Will start Abilify 2 mg p.o. daily for mood signs and symptoms.  Patient to follow-up in 4 weeks or sooner if clinically indicated. Patient advised to contact office with any questions, adverse effects, or acute worsening in signs and symptoms.  Depression, unspecified depression type - Plan: Aripiprazole 1 MG/ML mL  Please see After Visit Summary for patient specific instructions.  Future Appointments  Date Time Provider Department Center  11/28/2018  9:30 AM Corie Chiquito, PMHNP [facility] None    No orders of the defined types were placed in this encounter.     -------------------------------

## 2018-11-03 ENCOUNTER — Other Ambulatory Visit: Payer: Self-pay | Admitting: Psychiatry

## 2018-11-03 DIAGNOSIS — F32A Depression, unspecified: Secondary | ICD-10-CM

## 2018-11-03 DIAGNOSIS — F329 Major depressive disorder, single episode, unspecified: Secondary | ICD-10-CM

## 2018-11-04 ENCOUNTER — Encounter: Payer: Self-pay | Admitting: Psychiatry

## 2018-11-10 ENCOUNTER — Other Ambulatory Visit: Payer: Self-pay

## 2018-11-10 DIAGNOSIS — F329 Major depressive disorder, single episode, unspecified: Secondary | ICD-10-CM

## 2018-11-10 DIAGNOSIS — F32A Depression, unspecified: Secondary | ICD-10-CM

## 2018-11-10 MED ORDER — ARIPIPRAZOLE 1 MG/ML PO SOLN: 2.0000 mg | Freq: Every day | ORAL | 0 refills | Status: DC

## 2018-11-10 NOTE — Telephone Encounter (Signed)
Pharmacy said they deleted the rx for aripiprazole solution and asking to resend.

## 2018-11-13 DIAGNOSIS — F4310 Post-traumatic stress disorder, unspecified: Secondary | ICD-10-CM | POA: Diagnosis not present

## 2018-11-21 ENCOUNTER — Telehealth: Payer: Self-pay | Admitting: Psychiatry

## 2018-11-21 NOTE — Telephone Encounter (Signed)
Patient's father Junie called asking to speak with you, did not say why he was calling just wants you to call him regarding patient, [phone]

## 2018-11-22 NOTE — Telephone Encounter (Signed)
Has pt signed release of information for his father?

## 2018-11-28 ENCOUNTER — Encounter: Payer: Self-pay | Admitting: Psychiatry

## 2018-11-28 ENCOUNTER — Other Ambulatory Visit: Payer: Self-pay

## 2018-11-28 ENCOUNTER — Ambulatory Visit (INDEPENDENT_AMBULATORY_CARE_PROVIDER_SITE_OTHER): Payer: 59 | Admitting: Psychiatry

## 2018-11-28 DIAGNOSIS — F32A Depression, unspecified: Secondary | ICD-10-CM

## 2018-11-28 DIAGNOSIS — F329 Major depressive disorder, single episode, unspecified: Secondary | ICD-10-CM

## 2018-11-28 MED ORDER — ARIPIPRAZOLE 2 MG PO TABS: 2.0000 mg | ORAL_TABLET | Freq: Every day | ORAL | 1 refills | Status: DC

## 2018-11-28 NOTE — Telephone Encounter (Signed)
Received call from pt's father. Father reports that Abilify oral solution would be $400. Father reports that he is paying for pt's medical care and is requesting that [place]  Father reports that pt has been buying "a lot of inexpensive stuff" and is concerned about possible manic s/s.   Will f/u with pt re: changing Abilify.

## 2018-11-28 NOTE — Progress Notes (Signed)
[name] [id number] 15-Jan-1991 28 y.o.  Virtual Visit via Video Note  I connected with pt on 11/28/18 at  9:30 AM EDT by a video enabled telemedicine application and verified that I am speaking with the correct person using two identifiers.   I discussed the limitations of evaluation and management by telemedicine and the availability of in person appointments. The patient expressed understanding and agreed to proceed.  I discussed the assessment and treatment plan with the patient. The patient was provided an opportunity to ask questions and all were answered. The patient agreed with the plan and demonstrated an understanding of the instructions.   The patient was advised to call back or seek an in-person evaluation if the symptoms worsen or if the condition fails to improve as anticipated.  I provided 30 minutes of non-face-to-face time during this encounter.  The patient was located at home.  The provider was located at home.   Corie Chiquito, PMHNP   Subjective:   Patient ID:  [name] is a 28 y.o. (DOB [birth year]) male.  Chief Complaint:  Chief Complaint  Patient presents with  . Follow-up    h/o mood instability and anxiety    HPI [name] presents to the office today for follow-up of mood and anxiety. HE reports that he was unable to fill Abilify oral solution due to cost. He reports that he just went to [place] for a trip. Denies anxiety. He reports that he will feel "a little down here and there," often upon awakening for about 15 minutes. He denies unexplained sadness. Denies elevated mood or irritability. He reports sleeping well other than staying up late the last 2 days. Estimates sleeping 7-8 hours a night. He reports sometimes taking 1-2 hours to fall asleep and will need to read or watch videos to wind down. Appetite has been good. He reports that his energy and motivation have been "pretty good." Denies elevated energy. Denies impulsive  or risky behaviors. Reports some "splurging here and there." Reports that they bought a new TV and some things from [employer]. Reports that he has been splitting these expenses with his girlfriend. He reports adequate concentration and has been applying to return to school. Would like to go to [employer] in the fall to study architectural technology. Denies SI.  Reports that he continues to be furloughed from job and waiting to hear about return to work.   Past Psychiatric Medication Trials: Zyprexa- Prescribed and reports that he did not take Prozac- effective. Reports side effects at higher dose. Reports that it has been effective for mood and anxiety.  Hydroxyzine- effective. Causes some drowsiness.  Trazodone- took during hospitalization. Effective.   Review of Systems:  Review of Systems  HENT: Positive for rhinorrhea and sneezing.   Eyes: Positive for discharge and itching.  Gastrointestinal:       Reports that his constipation and hemorrhoids have improved.   Musculoskeletal: Negative for gait problem.  Allergic/Immunologic: Positive for environmental allergies.  Neurological: Negative for tremors.  Psychiatric/Behavioral:       Please refer to HPI    Medications: I have reviewed the patient's current medications.  Current Outpatient Medications  Medication Sig Dispense Refill  . loratadine (CLARITIN) 10 MG tablet Take 10 mg by mouth daily as needed for allergies.    . polyethylene glycol (MIRALAX / GLYCOLAX) 17 g packet Take 17 g by mouth daily.    . ARIPiprazole (ABILIFY) 2 MG tablet Take 1 tablet (2 mg total)  by mouth daily for 30 days. 30 tablet 1   No current facility-administered medications for this visit.     Medication Side Effects: Other: N/A  Allergies: No Known Allergies  Past Medical History:  Diagnosis Date  . Depression   . Hepatitis B     Family History  Adopted: Yes  Family history unknown: Yes    Social History   Socioeconomic History  .  Marital status: Single    Spouse name: Not on file  . Number of children: Not on file  . Years of education: Not on file  . Highest education level: Not on file  Occupational History  . Not on file  Social Needs  . Financial resource strain: Not on file  . Food insecurity:    Worry: Not on file    Inability: Not on file  . Transportation needs:    Medical: Not on file    Non-medical: Not on file  Tobacco Use  . Smoking status: Current Every Day Smoker    Packs/day: 0.50    Last attempt to quit: 04/19/2012    Years since quitting: 6.6  . Smokeless tobacco: Never Used  Substance and Sexual Activity  . 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 Drug use: No  . Sexual activity: Not on file  Lifestyle  . Physical activity:    Days per week: Not on file    Minutes per session: Not on file  . Stress: Not on file  Relationships  . Social connections:    Talks on phone: Not on file    Gets together: Not on file    Attends religious service: Not on file    Active member of club or organization: Not on file    Attends meetings of clubs or organizations: Not on file    Relationship status: Not on file  . Intimate partner violence:    Fear of current or ex partner: Not on file    Emotionally abused: Not on file    Physically abused: Not on file    Forced sexual activity: Not on file  Other Topics Concern  . Not on file  Social History Narrative  . Not on file    Past Medical History, Surgical history, Social history, and Family history were reviewed and updated as appropriate.   Please see review of systems for further details on the patient's review from today.   Objective:   Physical Exam:  There were no vitals taken for this visit.  Physical Exam Neurological:     Mental Status: He is alert and oriented to person, place, and time.     Cranial Nerves: No dysarthria.  Psychiatric:        Attention and Perception: Attention normal.         Mood and Affect: Mood normal.        Speech: Speech normal.        Behavior: Behavior is cooperative.        Thought Content: Thought content normal. Thought content is not paranoid or delusional. Thought content does not include homicidal or suicidal ideation. Thought content does not include homicidal or suicidal plan.        Cognition and Memory: Cognition and memory normal.        Judgment: Judgment normal.     Lab Review:     Component Value Date/Time   NA 141 06/23/2018 0506   K 3.7 06/23/2018 0506  CL 104 06/23/2018 0506   CO2 26 06/23/2018 0506   GLUCOSE 142 (H) 06/23/2018 0506   BUN 21 (H) 06/23/2018 0506   CREATININE 0.93 06/23/2018 0506   CALCIUM 9.4 06/23/2018 0506   PROT 8.2 (H) 04/08/2017 0922   ALBUMIN 4.7 04/08/2017 0922   AST 31 04/08/2017 0922   ALT 27 04/08/2017 0922   ALKPHOS 67 04/08/2017 0922   BILITOT 0.7 04/08/2017 0922   GFRNONAA >60 06/23/2018 0506   GFRAA >60 06/23/2018 0506       Component Value Date/Time   WBC 8.5 06/23/2018 0506   RBC 5.55 06/23/2018 0506   HGB 16.0 06/23/2018 0506   HCT 47.3 06/23/2018 0506   PLT 215 06/23/2018 0506   MCV 85.2 06/23/2018 0506   MCH 28.8 06/23/2018 0506   MCHC 33.8 06/23/2018 0506   RDW 12.6 06/23/2018 0506   LYMPHSABS 1.0 07/13/2010 0142   MONOABS 0.7 07/13/2010 0142   EOSABS 0.3 07/13/2010 0142   BASOSABS 0.1 07/13/2010 0142    No results found for: POCLITH, LITHIUM   No results found for: PHENYTOIN, PHENOBARB, VALPROATE, CBMZ   .res Assessment: Plan:   Will change Abilify from oral solution to tablet form due to cost.  Discussed that tablet could be crushed due to swallowing difficulties. Patient to follow-up in 4 weeks or sooner if clinically indicated. Patient advised to contact office with any questions, adverse effects, or acute worsening in signs and symptoms.  Depression, unspecified depression type - Plan: ARIPiprazole (ABILIFY) 2 MG tablet  Please see After Visit Summary for patient  specific instructions.  No future appointments.  No orders of the defined types were placed in this encounter.     -------------------------------

## 2018-11-30 DIAGNOSIS — F4310 Post-traumatic stress disorder, unspecified: Secondary | ICD-10-CM | POA: Diagnosis not present

## 2018-12-21 DIAGNOSIS — F4310 Post-traumatic stress disorder, unspecified: Secondary | ICD-10-CM | POA: Diagnosis not present

## 2019-02-21 ENCOUNTER — Other Ambulatory Visit: Payer: Self-pay

## 2019-02-21 DIAGNOSIS — Z20822 Contact with and (suspected) exposure to covid-19: Secondary | ICD-10-CM

## 2019-02-22 LAB — NOVEL CORONAVIRUS, NAA: SARS-CoV-2, NAA: NOT DETECTED

## 2019-09-20 ENCOUNTER — Ambulatory Visit: Payer: Self-pay | Attending: Internal Medicine

## 2019-09-20 DIAGNOSIS — Z23 Encounter for immunization: Secondary | ICD-10-CM

## 2019-09-20 NOTE — Progress Notes (Signed)
   Covid-19 Vaccination Clinic  Name:  [name]    MRN: [id number] DOB: 1990-07-25  09/20/2019  [name] was observed post Covid-19 immunization for 15 minutes without incident. He was provided with Vaccine Information Sheet and instruction to access the V-Safe system.   [name] was instructed to call 911 with any severe reactions post vaccine: Marland Kitchen Difficulty breathing  . Swelling of face and throat  . A fast heartbeat  . A bad rash all over body  . Dizziness and weakness   Immunizations Administered    Name Date Dose VIS Date Route   Pfizer COVID-19 Vaccine 09/20/2019  2:41 PM 0.3 mL 06/22/2019 Intramuscular   Manufacturer: [employer], [employer]   Lot: [id number]   NDC: [address]-8

## 2019-10-15 ENCOUNTER — Ambulatory Visit: Payer: Self-pay | Attending: Internal Medicine

## 2019-10-15 DIAGNOSIS — Z23 Encounter for immunization: Secondary | ICD-10-CM

## 2019-10-15 NOTE — Progress Notes (Signed)
   Covid-19 Vaccination Clinic  Name:  [name]    MRN: [id number] DOB: 1990/08/01  10/15/2019  Mr. Garbutt was observed post Covid-19 immunization for 15 minutes without incident. He was provided with Vaccine Information Sheet and instruction to access the V-Safe system.   Mr. Lippman was instructed to call 911 with any severe reactions post vaccine: Marland Kitchen Difficulty breathing  . Swelling of face and throat  . A fast heartbeat  . A bad rash all over body  . Dizziness and weakness   Immunizations Administered    Name Date Dose VIS Date Route   Pfizer COVID-19 Vaccine 10/15/2019 10:58 AM 0.3 mL 06/22/2019 Intramuscular   Manufacturer: [employer], [employer]   Lot: [id number]   NDC: [address]-8

## 2020-03-12 DIAGNOSIS — R202 Paresthesia of skin: Secondary | ICD-10-CM | POA: Diagnosis not present

## 2020-03-12 DIAGNOSIS — G5602 Carpal tunnel syndrome, left upper limb: Secondary | ICD-10-CM | POA: Diagnosis not present

## 2020-04-02 DIAGNOSIS — K219 Gastro-esophageal reflux disease without esophagitis: Secondary | ICD-10-CM | POA: Diagnosis not present

## 2020-04-02 DIAGNOSIS — Z23 Encounter for immunization: Secondary | ICD-10-CM | POA: Diagnosis not present

## 2020-06-19 DIAGNOSIS — K219 Gastro-esophageal reflux disease without esophagitis: Secondary | ICD-10-CM | POA: Diagnosis not present

## 2020-06-19 DIAGNOSIS — Z23 Encounter for immunization: Secondary | ICD-10-CM | POA: Diagnosis not present

## 2020-06-19 DIAGNOSIS — E7800 Pure hypercholesterolemia, unspecified: Secondary | ICD-10-CM | POA: Diagnosis not present

## 2020-06-19 DIAGNOSIS — Z0000 Encounter for general adult medical examination without abnormal findings: Secondary | ICD-10-CM | POA: Diagnosis not present

## 2020-06-19 DIAGNOSIS — R066 Hiccough: Secondary | ICD-10-CM | POA: Diagnosis not present

## 2020-08-26 DIAGNOSIS — Z8619 Personal history of other infectious and parasitic diseases: Secondary | ICD-10-CM | POA: Diagnosis not present

## 2020-08-26 DIAGNOSIS — K219 Gastro-esophageal reflux disease without esophagitis: Secondary | ICD-10-CM | POA: Diagnosis not present

## 2020-08-26 DIAGNOSIS — K7400 Hepatic fibrosis, unspecified: Secondary | ICD-10-CM | POA: Diagnosis not present

## 2020-10-06 DIAGNOSIS — Z1159 Encounter for screening for other viral diseases: Secondary | ICD-10-CM | POA: Diagnosis not present

## 2020-10-09 DIAGNOSIS — K209 Esophagitis, unspecified without bleeding: Secondary | ICD-10-CM | POA: Diagnosis not present

## 2020-10-09 DIAGNOSIS — K219 Gastro-esophageal reflux disease without esophagitis: Secondary | ICD-10-CM | POA: Diagnosis not present

## 2020-10-09 DIAGNOSIS — K2930 Chronic superficial gastritis without bleeding: Secondary | ICD-10-CM | POA: Diagnosis not present

## 2021-09-03 DIAGNOSIS — H6690 Otitis media, unspecified, unspecified ear: Secondary | ICD-10-CM | POA: Diagnosis not present

## 2022-03-22 ENCOUNTER — Emergency Department (HOSPITAL_COMMUNITY): Payer: BLUE CROSS/BLUE SHIELD

## 2022-03-22 ENCOUNTER — Emergency Department (HOSPITAL_COMMUNITY)
Admission: EM | Admit: 2022-03-22 | Discharge: 2022-03-22 | Disposition: A | Discharge status: Home / Self Care | Payer: BLUE CROSS/BLUE SHIELD | Attending: Emergency Medicine | Admitting: Emergency Medicine

## 2022-03-22 ENCOUNTER — Encounter (HOSPITAL_COMMUNITY): Payer: Self-pay | Admitting: Emergency Medicine

## 2022-03-22 DIAGNOSIS — K760 Fatty (change of) liver, not elsewhere classified: Secondary | ICD-10-CM | POA: Diagnosis not present

## 2022-03-22 DIAGNOSIS — N200 Calculus of kidney: Secondary | ICD-10-CM | POA: Diagnosis not present

## 2022-03-22 DIAGNOSIS — Z85118 Personal history of other malignant neoplasm of bronchus and lung: Secondary | ICD-10-CM | POA: Insufficient documentation

## 2022-03-22 DIAGNOSIS — R739 Hyperglycemia, unspecified: Secondary | ICD-10-CM | POA: Insufficient documentation

## 2022-03-22 DIAGNOSIS — R1032 Left lower quadrant pain: Secondary | ICD-10-CM | POA: Diagnosis not present

## 2022-03-22 DIAGNOSIS — N132 Hydronephrosis with renal and ureteral calculous obstruction: Secondary | ICD-10-CM | POA: Diagnosis not present

## 2022-03-22 DIAGNOSIS — K820 Obstruction of gallbladder: Secondary | ICD-10-CM | POA: Diagnosis not present

## 2022-03-22 DIAGNOSIS — R319 Hematuria, unspecified: Secondary | ICD-10-CM | POA: Diagnosis not present

## 2022-03-22 LAB — BASIC METABOLIC PANEL
Anion gap: 5 (ref 5–15)
BUN: 13 mg/dL (ref 6–20)
CO2: 27 mmol/L (ref 22–32)
Calcium: 9.4 mg/dL (ref 8.9–10.3)
Chloride: 110 mmol/L (ref 98–111)
Creatinine, Ser: 0.76 mg/dL (ref 0.61–1.24)
GFR, Estimated: >60 mL/min (ref >60)
Glucose, Bld: 116 mg/dL — ABNORMAL HIGH (ref 70–99)
Potassium: 3.9 mmol/L (ref 3.5–5.1)
Sodium: 142 mmol/L (ref 135–145)

## 2022-03-22 LAB — CBC
HCT: 48.6 % (ref 39.0–52.0)
Hemoglobin: 16.9 g/dL (ref 13.0–17.0)
MCH: 29.8 pg (ref 26.0–34.0)
MCHC: 34.8 g/dL (ref 30.0–36.0)
MCV: 85.7 fL (ref 80.0–100.0)
Platelets: 210 10*3/uL (ref 150–400)
RBC: 5.67 MIL/uL (ref 4.22–5.81)
RDW: 12.9 % (ref 11.5–15.5)
WBC: 6.5 10*3/uL (ref 4.0–10.5)
nRBC: 0 % (ref 0.0–0.2)

## 2022-03-22 MED ORDER — ONDANSETRON HCL 4 MG PO TABS: 4.0000 mg | ORAL_TABLET | Freq: Four times a day (QID) | ORAL | 0 refills | Status: DC

## 2022-03-22 MED ORDER — TAMSULOSIN HCL 0.4 MG PO CAPS: 0.4000 mg | ORAL_CAPSULE | Freq: Every day | ORAL | 0 refills | Status: DC

## 2022-03-22 MED ORDER — OXYCODONE-ACETAMINOPHEN 5-325 MG PO TABS: 1.0000 | ORAL_TABLET | Freq: Four times a day (QID) | ORAL | 0 refills | Status: DC | PRN

## 2022-03-22 NOTE — Discharge Instructions (Signed)
You were seen in the emergency department and found to have a kidney stone.  Please take ibuprofen as needed for pain.  We are sending you home with multiple medications to assist with passing the stone and for residual pain/nausea:  -Flomax-this is a medication to help pass the stone, it allows urine to exit the body more freely.  Please take this once daily with a meal.  -Percocet-this is a narcotic/controlled substance medication that has potential addicting qualities.  We recommend that you take 1-2 tablets every 6 hours as needed for severe pain.  Do not drive or operate heavy machinery when taking this medicine as it can be sedating. Do not drink alcohol or take other sedating medications when taking this medicine for safety reasons.  Keep this out of reach of small children.  Please be aware this medicine has Tylenol in it (325 mg/tab) do not exceed the maximum dose of Tylenol in a day per over the counter recommendations should you decide to supplement with Tylenol over the counter.   -Zofran-this is an antinausea medication, you may take this every 8 hours as needed for nausea and vomiting, please allow the tablet to dissolve underneath of your tongue.   We have prescribed you new medication(s) today. Discuss the medications prescribed today with your pharmacist as they can have adverse effects and interactions with your other medicines including over the counter and prescribed medications. Seek medical evaluation if you start to experience new or abnormal symptoms after taking one of these medicines, seek care immediately if you start to experience difficulty breathing, feeling of your throat closing, facial swelling, or rash as these could be indications of a more serious allergic reaction  Please follow-up with the urology group provided in your discharge instructions within 3 to 5 days.  Return to the ER for new or worsening symptoms including but not limited to worsening pain not controlled  by these medicines, inability to keep fluids down, fever, or any other concerns that you may have.   

## 2022-03-22 NOTE — ED Triage Notes (Signed)
Patient c/o hematuria x3 days. States pain in LLQ since yesterday. Hx kidney stones. Denies difficulty urinating and pain with urination.

## 2022-03-22 NOTE — ED Provider Notes (Signed)
[facility]-EMERGENCY DEPT Provider Note   CSN: [id number] Arrival date & time: 03/22/22  1341     History  Chief Complaint  Patient presents with   Hematuria    [name] is a 31 y.o. male with past medical history significant for lung cancer depression, hepatitis B, previous kidney stones who presents with concern for pain in left lower quadrant, left flank since yesterday, as well as hematuria for the last 3 days.  Patient reports it feels congested similar to previous kidney stones.  He reports that he has not had any difficulty urinating or significant pain with urination.  He denies foul smell, fever, chills.  He denies significant nausea, vomiting, he has not taken anything for pain prior to arrival and reports pain is 3/10.   Hematuria       Home Medications Prior to Admission medications   Medication Sig Start Date End Date Taking? Authorizing Provider  ondansetron (ZOFRAN) 4 MG tablet Take 1 tablet (4 mg total) by mouth every 6 (six) hours. 03/22/22  Yes Jarrette Dehner H, PA-C  oxyCODONE-acetaminophen (PERCOCET/ROXICET) 5-325 MG tablet Take 1 tablet by mouth every 6 (six) hours as needed for severe pain. 03/22/22  Yes  Ducre H, PA-C  tamsulosin (FLOMAX) 0.4 MG CAPS capsule Take 1 capsule (0.4 mg total) by mouth daily. 03/22/22  Yes Nik Gorrell H, PA-C  ARIPiprazole (ABILIFY) 2 MG tablet Take 1 tablet (2 mg total) by mouth daily for 30 days. 11/28/18 12/28/18  Corie Chiquito, PMHNP  loratadine (CLARITIN) 10 MG tablet Take 10 mg by mouth daily as needed for allergies.    [provider]  polyethylene glycol (MIRALAX / GLYCOLAX) 17 g packet Take 17 g by mouth daily.    [provider]      Allergies    Patient has no known allergies.    Review of Systems   Review of Systems  Genitourinary:  Positive for hematuria.  All other systems reviewed and are negative.   Physical Exam Updated Vital  Signs BP 121/79 (BP Location: Left Arm)   Pulse 84   Temp 98.1 F (36.7 C) (Oral)   Resp 18   SpO2 97%  Physical Exam Vitals and nursing note reviewed.  Constitutional:      General: He is not in acute distress.    Appearance: Normal appearance.  HENT:     Head: Normocephalic and atraumatic.  Eyes:     General:        Right eye: No discharge.        Left eye: No discharge.  Cardiovascular:     Rate and Rhythm: Normal rate and regular rhythm.     Heart sounds: No murmur heard.    No friction rub. No gallop.  Pulmonary:     Effort: Pulmonary effort is normal. No respiratory distress.     Breath sounds: Normal breath sounds.  Abdominal:     General: Bowel sounds are normal.     Palpations: Abdomen is soft.     Comments: Tenderness to palpation in the left flank, no rebound, rigidity, guarding.  No significant CVA tenderness noted on my exam.  Musculoskeletal:        General: No deformity.  Skin:    General: Skin is warm and dry.     Capillary Refill: Capillary refill takes less than 2 seconds.  Neurological:     Mental Status: He is alert and oriented to person, place, and time.  Psychiatric:  Mood and Affect: Mood normal.        Behavior: Behavior normal.     ED Results / Procedures / Treatments   Labs (all labs ordered are listed, but only abnormal results are displayed) Labs Reviewed  BASIC METABOLIC PANEL - Abnormal; Notable for the following components:      Result Value   Glucose, Bld 116 (*)    All other components within normal limits  CBC  URINALYSIS, ROUTINE W REFLEX MICROSCOPIC    EKG None  Radiology CT Renal Stone Study  Result Date: 03/22/2022 CLINICAL DATA:  Flank pain, kidney stone suspected EXAM: CT ABDOMEN AND PELVIS WITHOUT CONTRAST TECHNIQUE: Multidetector CT imaging of the abdomen and pelvis was performed following the standard protocol without IV contrast. RADIATION DOSE REDUCTION: This exam was performed according to the  departmental dose-optimization program which includes automated exposure control, adjustment of the mA and/or kV according to patient size and/or use of iterative reconstruction technique. COMPARISON:  2019 FINDINGS: Lower chest: No acute abnormality. Hepatobiliary: Hepatic steatosis. Gallbladder is contracted. No biliary dilatation. Pancreas: Unremarkable. No pancreatic ductal dilatation or surrounding inflammatory changes. Spleen: Normal in size without focal abnormality. Adrenals/Urinary Tract: Adrenals are unremarkable. Unchanged 1 cm stone of the left kidney posteriorly. New punctate 1 mm interpolar stone the left. New 3 mm stone at the proximal left ureter just below the ureteropelvic junction. Minimal hydronephrosis. The right kidney is unremarkable. Bladder is unremarkable. Stomach/Bowel: Stomach is within normal limits.  Normal appendix. Vascular/Lymphatic: No significant vascular abnormality on this noncontrast study. No enlarged nodes. Reproductive: Unremarkable. Other: No free fluid.  Abdominal wall is unremarkable. Musculoskeletal: No acute or significant osseous abnormality. IMPRESSION: 3 mm stone just below the left ureteropelvic junction with minimal hydronephrosis. Additional nonobstructing left renal calculi. Hepatic steatosis. Electronically Signed   By: Guadlupe Spanish M.D.   On: 03/22/2022 14:42    Procedures Procedures    Medications Ordered in ED Medications - No data to display  ED Course/ Medical Decision Making/ A&P                           Medical Decision Making Amount and/or Complexity of Data Reviewed Labs: ordered. Radiology: ordered.   This patient is a 31 y.o. male who presents to the ED for concern of left-sided flank pain, hematuria, this involves an extensive number of treatment options, and is a complaint that carries with it a high risk of complications and morbidity. The emergent differential diagnosis prior to evaluation includes, but is not limited to, UTI,  pyelonephritis, bladder mass, neoplasm, nephrolithiasis versus other.   This is not an exhaustive differential.   Past Medical History / Co-morbidities / Social History: History of hepatitis, depression, previous kidney stones  Additional history: Chart reviewed. Pertinent results include: Reviewed lab work, imaging from previous diagnosis of kidney stone in 2019, outpatient behavioral health visits  Physical Exam: Physical exam performed. The pertinent findings include: On exam patient is in no acute distress, with stable vital signs other than some tachycardia on arrival which resolved on recheck, he does have some tenderness of the left flank without rebound, rigidity, guarding, negative CVA tenderness.  Lab Tests: I ordered, and personally interpreted labs.  The pertinent results include: CBC unremarkable, BMP overall reassuring with mild hyperglycemia of nonfasting lab values at 116.  Kidney function normal with creatinine 0.76.  Patient did not provide a urine sample in the emergency department today, but went to walk-in clinic prior  to arrival in the emergency department had a urinalysis which showed no evidence of urinary tract infection.  I took a Building services engineer of those results and this information can be found in the media tab for the patient.  No evidence of leukocytes, nitrates, bacteria, or white blood cells noted in his urine.    Imaging Studies: I ordered imaging studies including CT renal stone study. I independently visualized and interpreted imaging which showed 3 mm stone just below the left UPJ without significant hydronephrosis.,  Other intrarenal stones noted. I agree with the radiologist interpretation.   Disposition: After consideration of the diagnostic results and the patients response to treatment, I feel that we will discharge patient with pain medication, nausea medication, Flomax, urology follow-up.   emergency department workup does not suggest an emergent condition  requiring admission or immediate intervention beyond what has been performed at this time. The plan is: as above. The patient is safe for discharge and has been instructed to return immediately for worsening symptoms, change in symptoms or any other concerns.  I discussed this case with my attending physician Dr. Eloise Harman who cosigned this note including patient's presenting symptoms, physical exam, and planned diagnostics and interventions. Attending physician stated agreement with plan or made changes to plan which were implemented.    Final Clinical Impression(s) / ED Diagnoses Final diagnoses:  Nephrolithiasis    Rx / DC Orders ED Discharge Orders          Ordered    tamsulosin (FLOMAX) 0.4 MG CAPS capsule  Daily        03/22/22 2100    oxyCODONE-acetaminophen (PERCOCET/ROXICET) 5-325 MG tablet  Every 6 hours PRN        03/22/22 2100    ondansetron (ZOFRAN) 4 MG tablet  Every 6 hours        03/22/22 2100              Keajah Killough, Edyth Gunnels 03/22/22 2117    Rondel Baton, MD 03/23/22 [phone]

## 2022-03-22 NOTE — ED Provider Triage Note (Signed)
Emergency Medicine Provider Triage Evaluation Note  [name] , a 31 y.o. male  was evaluated in triage.  Pt complains of left flank pain, hematuria, hx of kidney stones. Sent by PCP to r/o kidney stones.  Review of Systems  Positive: Flank pain, hematuria Negative: Fever, chills  Physical Exam  BP (!) 138/91 (BP Location: Left Arm)   Pulse (!) 121   Temp 98.1 F (36.7 C) (Oral)   Resp 18   SpO2 96%  Gen:   Awake, no distress   Resp:  Normal effort  MSK:   Moves extremities without difficulty  Other:  TTP left flank without CVA, tachycardia with normal rhythm  Medical Decision Making  Medically screening exam initiated at 2:08 PM.  Appropriate orders placed.  Bassem C Jafri was informed that the remainder of the evaluation will be completed by another provider, this initial triage assessment does not replace that evaluation, and the importance of remaining in the ED until their evaluation is complete.  Workup intiated   Olene Floss, [place] 03/22/22 1410

## 2022-04-30 DIAGNOSIS — Z23 Encounter for immunization: Secondary | ICD-10-CM | POA: Diagnosis not present

## 2022-04-30 DIAGNOSIS — Z0000 Encounter for general adult medical examination without abnormal findings: Secondary | ICD-10-CM | POA: Diagnosis not present

## 2022-04-30 DIAGNOSIS — E7800 Pure hypercholesterolemia, unspecified: Secondary | ICD-10-CM | POA: Diagnosis not present

## 2022-04-30 DIAGNOSIS — R002 Palpitations: Secondary | ICD-10-CM | POA: Diagnosis not present

## 2022-05-12 DIAGNOSIS — R002 Palpitations: Secondary | ICD-10-CM

## 2022-05-12 HISTORY — DX: Palpitations: R00.2

## 2022-05-24 ENCOUNTER — Ambulatory Visit: Payer: BLUE CROSS/BLUE SHIELD | Attending: Internal Medicine | Admitting: Internal Medicine

## 2022-05-24 ENCOUNTER — Encounter: Payer: Self-pay | Admitting: Internal Medicine

## 2022-05-24 ENCOUNTER — Ambulatory Visit (INDEPENDENT_AMBULATORY_CARE_PROVIDER_SITE_OTHER): Payer: BLUE CROSS/BLUE SHIELD

## 2022-05-24 VITALS — BP 118/60 | HR 64 | Ht 62.0 in | Wt 179.8 lb

## 2022-05-24 DIAGNOSIS — R002 Palpitations: Secondary | ICD-10-CM | POA: Diagnosis not present

## 2022-05-24 NOTE — Progress Notes (Unsigned)
Enrolled for [employer] to mail a ZIO XT long term holter monitor to the patients address on file.  

## 2022-05-24 NOTE — Progress Notes (Signed)
Cardiology Office Note:    Date:  05/24/2022   ID:  [name], DOB [birth year], MRN [id number]  PCP:  Shon Hale, MD   [facility] Providers Cardiologist:  Maisie Fus, MD     Referring MD: Shon Hale, *   No chief complaint on file. Palpitations  History of Present Illness:    [name] is a 31 y.o. male with a hx of referral for palpitations. He notes fast heart beating. Not correlated with anything. He denies LH, dizziness, syncope,  etoh. Adopted, unknown family. She has no known structural heart dx. 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   Past Medical History:  Diagnosis Date   Depression    Hepatitis B     Past Surgical History:  Procedure Laterality Date   CLEFT LIP REPAIR     MANDIBLE SURGERY      Current Medications: Current Meds  Medication Sig   loratadine (CLARITIN) 10 MG tablet Take 10 mg by mouth daily as needed for allergies.   tamsulosin (FLOMAX) 0.4 MG CAPS capsule Take 1 capsule (0.4 mg total) by mouth daily.     Allergies:   Patient has no known allergies.   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 Family history is unknown by patient.  ROS:   Please see the history of present illness.     All other systems reviewed and are negative.  EKGs/Labs/Other Studies Reviewed:    The following studies were reviewed today:   EKG:  EKG is  ordered today.  The ekg ordered today demonstrates   05/24/2012- NSR  Recent Labs: 03/22/2022: BUN 13; Creatinine, Ser 0.76; Hemoglobin 16.9; Platelets 210; Potassium 3.9; Sodium 142   Recent Lipid Panel No results found for: "CHOL", "TRIG", "HDL", "CHOLHDL", "VLDL", "LDLCALC", "LDLDIRECT"   Risk Assessment/Calculations:     Physical Exam:    VS:    Vitals:   05/24/22 0846  BP: 118/60  Pulse: 64  SpO2: 98%     Wt Readings from Last 3 Encounters:  05/24/22 179 lb 12.8 oz (81.6 kg)  06/23/18 150 lb (68 kg)  04/08/17 150 lb (68 kg)     GEN:  Well nourished, well developed in no acute distress HEENT: Normal NECK: No JVD; No carotid bruits LYMPHATICS: No lymphadenopathy CARDIAC: RRR, no  murmurs, rubs, gallops RESPIRATORY:  Clear to auscultation without rales, wheezing or rhonchi  ABDOMEN: Soft, non-tender, non-distended MUSCULOSKELETAL:  No edema; No deformity  SKIN: Warm and dry NEUROLOGIC:  Alert and oriented x 3 PSYCHIATRIC:  Normal affect   ASSESSMENT:   Palpitations: He does not have high risk features including syncope c/f arrhythmia , no abnormalities on his EKG. Discussed cutting caffeine and hydration with electrolytes.  PLAN:    In order of problems listed above:  3 day ziopatch PRN follow up      Medication Adjustments/Labs and Tests Ordered: Current medicines are reviewed at length with the patient today.  Concerns regarding medicines are outlined above.  Orders Placed This Encounter  Procedures   LONG TERM MONITOR (3-14 DAYS)   EKG 12-Lead   No orders of the defined types were  placed in this encounter.   Patient Instructions  Medication Instructions:  No Changes In Medications at this time.  *If you need a refill on your cardiac medications before your next appointment, please call your pharmacy*  Lab Work: None Ordered At This Time.  If you have labs (blood work) drawn today and your tests are completely normal, you will receive your results only by: MyChart Message (if you have MyChart) OR A paper copy in the mail If you have any lab test that is abnormal or we need to change your treatment, we will call you to review the results.  Testing/Procedures:  Christena Deem- Long Term Monitor Instructions   Your physician has requested you wear your ZIO patch monitor__3_____days.   This is a single patch monitor.  [employer] supplies one patch monitor per enrollment.  Additional stickers are not available.   Please do not apply patch if you will be having a Nuclear Stress Test, Echocardiogram, Cardiac CT, MRI, or Chest Xray during the time frame you would be wearing the monitor. The patch cannot be worn during these tests.  You cannot remove and re-apply the ZIO XT patch monitor.   Your ZIO patch monitor will be sent USPS Priority mail from [facility] directly to your home address. The monitor may also be mailed to a PO BOX if home delivery is not available.   It may take 3-5 days to receive your monitor after you have been enrolled.   Once you have received you monitor, please review enclosed instructions.  Your monitor has already been registered assigning a specific monitor serial # to you.   Applying the monitor   Shave hair from upper left chest.   Hold abrader disc by orange tab.  Rub abrader in 40 strokes over left upper chest as indicated in your monitor instructions.   Clean area with 4 enclosed alcohol pads .  Use all pads to assure are is cleaned thoroughly.  Let dry.   Apply patch as indicated in monitor instructions.  Patch will be place under  collarbone on left side of chest with arrow pointing upward.   Rub patch adhesive wings for 2 minutes.Remove white label marked "1".  Remove white label marked "2".  Rub patch adhesive wings for 2 additional minutes.   While looking in a mirror, press and release button in center of patch.  A small green light will flash 3-4 times .  This will be your only indicator the monitor has been turned on.     Do not shower for the first 24 hours.  You may shower after the first 24 hours.   Press button if you  feel a symptom. You will hear a small click.  Record Date, Time and Symptom in the Patient Log Book.   When you are ready to remove patch, follow instructions on last 2 pages of Patient Log Book.  Stick patch monitor onto last page of Patient Log Book.   Place Patient Log Book in [place] box.  Use locking tab on box and tape box closed securely.  The [employer] and [employer] has [employer] on it.  Please place in mailbox as soon as possible.  Your physician should have your test results approximately 7 days after the monitor has been mailed back to [facility].   Call [facility] Customer Care at [phone] if you have questions regarding your ZIO XT patch monitor.  Call them immediately if you see an orange light blinking on your monitor.   If your monitor falls off in less than 4 days contact our Monitor department at [phone].  If your monitor becomes loose or falls off after 4 days call [employer] at [phone] for suggestions on securing your monitor.    Follow-Up: At [facility], you and your health needs are our priority.  As part of our continuing mission to provide you with exceptional heart care, we have created designated Provider Care Teams.  These Care Teams include your primary Cardiologist (physician) and Advanced Practice Providers (APPs -  Physician Assistants and Nurse Practitioners) who all work together to provide you with the care you need, when you need  it.  Your next appointment:   AS NEEDED   The format for your next appointment:   In Person  Provider:   Maisie Fus, MD          Signed, Maisie Fus, MD  05/24/2022 9:49 AM    [facility]

## 2022-05-24 NOTE — Patient Instructions (Signed)
Medication Instructions:  No Changes In Medications at this time.  *If you need a refill on your cardiac medications before your next appointment, please call your pharmacy*  Lab Work: None Ordered At This Time.  If you have labs (blood work) drawn today and your tests are completely normal, you will receive your results only by: MyChart Message (if you have MyChart) OR A paper copy in the mail If you have any lab test that is abnormal or we need to change your treatment, we will call you to review the results.  Testing/Procedures:  ZIO XT- Long Term Monitor Instructions   Your physician has requested you wear your ZIO patch monitor___3____days.   This is a single patch monitor.  [employer] supplies one patch monitor per enrollment.  Additional stickers are not available.   Please do not apply patch if you will be having a Nuclear Stress Test, Echocardiogram, Cardiac CT, MRI, or Chest Xray during the time frame you would be wearing the monitor. The patch cannot be worn during these tests.  You cannot remove and re-apply the ZIO XT patch monitor.   Your ZIO patch monitor will be sent USPS Priority mail from [employer] directly to your home address. The monitor may also be mailed to a PO BOX if home delivery is not available.   It may take 3-5 days to receive your monitor after you have been enrolled.   Once you have received you monitor, please review enclosed instructions.  Your monitor has already been registered assigning a specific monitor serial # to you.   Applying the monitor   Shave hair from upper left chest.   Hold abrader disc by orange tab.  Rub abrader in 40 strokes over left upper chest as indicated in your monitor instructions.   Clean area with 4 enclosed alcohol pads .  Use all pads to assure are is cleaned thoroughly.  Let dry.   Apply patch as indicated in monitor instructions.  Patch will be place under collarbone on left side of chest with arrow pointing  upward.   Rub patch adhesive wings for 2 minutes.Remove white label marked "1".  Remove white label marked "2".  Rub patch adhesive wings for 2 additional minutes.   While looking in a mirror, press and release button in center of patch.  A small green light will flash 3-4 times .  This will be your only indicator the monitor has been turned on.     Do not shower for the first 24 hours.  You may shower after the first 24 hours.   Press button if you feel a symptom. You will hear a small click.  Record Date, Time and Symptom in the Patient Log Book.   When you are ready to remove patch, follow instructions on last 2 pages of Patient Log Book.  Stick patch monitor onto last page of Patient Log Book.   Place Patient Log Book in [place] box.  Use locking tab on box and tape box closed securely.  The [employer] box has prepaid postage on it.  Please place in mailbox as soon as possible.  Your physician should have your test results approximately 7 days after the monitor has been mailed back to [employer].   Call [employer] Customer Care at [phone] if you have questions regarding your ZIO XT patch monitor.  Call them immediately if you see an orange light blinking on your monitor.   If your monitor falls off in less   than 4 days contact our Monitor department at [phone].  If your monitor becomes loose or falls off after 4 days call [employer] at [phone] for suggestions on securing your monitor.    Follow-Up: At [facility], you and your health needs are our priority.  As part of our continuing mission to provide you with exceptional heart care, we have created designated Provider Care Teams.  These Care Teams include your primary Cardiologist (physician) and Advanced Practice Providers (APPs -  Physician Assistants and Nurse Practitioners) who all work together to provide you with the care you need, when you need it.  Your next appointment:   AS NEEDED    The  format for your next appointment:   In Person  Provider:   Branch, Mary E, MD          

## 2022-05-26 DIAGNOSIS — R002 Palpitations: Secondary | ICD-10-CM

## 2022-06-08 ENCOUNTER — Encounter (HOSPITAL_BASED_OUTPATIENT_CLINIC_OR_DEPARTMENT_OTHER): Payer: Self-pay | Admitting: Urology

## 2022-06-08 ENCOUNTER — Other Ambulatory Visit: Payer: Self-pay | Admitting: Urology

## 2022-06-08 DIAGNOSIS — N202 Calculus of kidney with calculus of ureter: Secondary | ICD-10-CM | POA: Diagnosis not present

## 2022-06-08 NOTE — Progress Notes (Signed)
06/08/2022 2:36 PM Called patient at 1400 to discuss pre op lithotripsy instructions, pt. Stated he was driving and requested callback in 30 minutes. 1430 RN attempted to call patient back in 30 minutes as requested and call went straight to voicemail. LVM to return call to discuss lithotripsy instructions.  Binnie Droessler, Blanchard Kelch

## 2022-06-08 NOTE — Progress Notes (Signed)
06/08/2022 3:28 PM Pre procedure call completed. Pt. Updated on date and time of arrival 06/10/22 at 1430. Times for NPO and clear liquid consumption stopping at 1030 per protocol reviewed. Pt. Allergies, medical hx and medication list reviewed. Medications ok to take day of surgery and those to hold prior to surgery reviewed. Ride secured for day of surgery (one or both parents). Questions and concerns addressed by patient. Pt. Verbalized understanding of all instructions.  Menachem Urbanek, Blanchard Kelch

## 2022-06-10 ENCOUNTER — Encounter (HOSPITAL_BASED_OUTPATIENT_CLINIC_OR_DEPARTMENT_OTHER)
Admission: RE | Disposition: A | Discharge status: Home / Self Care | Payer: Self-pay | Source: Home / Self Care | Attending: Urology

## 2022-06-10 ENCOUNTER — Ambulatory Visit (HOSPITAL_COMMUNITY): Payer: BLUE CROSS/BLUE SHIELD

## 2022-06-10 ENCOUNTER — Encounter (HOSPITAL_BASED_OUTPATIENT_CLINIC_OR_DEPARTMENT_OTHER): Payer: Self-pay | Admitting: Urology

## 2022-06-10 ENCOUNTER — Ambulatory Visit (HOSPITAL_BASED_OUTPATIENT_CLINIC_OR_DEPARTMENT_OTHER)
Admission: RE | Admit: 2022-06-10 | Discharge: 2022-06-10 | Disposition: A | Discharge status: Home / Self Care | Payer: BLUE CROSS/BLUE SHIELD | Attending: Urology | Admitting: Urology

## 2022-06-10 ENCOUNTER — Other Ambulatory Visit: Payer: Self-pay

## 2022-06-10 DIAGNOSIS — I878 Other specified disorders of veins: Secondary | ICD-10-CM | POA: Diagnosis not present

## 2022-06-10 DIAGNOSIS — N200 Calculus of kidney: Secondary | ICD-10-CM | POA: Diagnosis not present

## 2022-06-10 DIAGNOSIS — K759 Inflammatory liver disease, unspecified: Secondary | ICD-10-CM | POA: Diagnosis not present

## 2022-06-10 DIAGNOSIS — N201 Calculus of ureter: Secondary | ICD-10-CM | POA: Insufficient documentation

## 2022-06-10 DIAGNOSIS — F17200 Nicotine dependence, unspecified, uncomplicated: Secondary | ICD-10-CM | POA: Insufficient documentation

## 2022-06-10 DIAGNOSIS — I499 Cardiac arrhythmia, unspecified: Secondary | ICD-10-CM | POA: Insufficient documentation

## 2022-06-10 HISTORY — DX: Personal history of urinary calculi: Z87.442

## 2022-06-10 HISTORY — PX: EXTRACORPOREAL SHOCK WAVE LITHOTRIPSY: SHX1557

## 2022-06-10 SURGERY — LITHOTRIPSY, ESWL
Anesthesia: LOCAL | Laterality: Left

## 2022-06-10 MED ORDER — AMISULPRIDE (ANTIEMETIC) 5 MG/2ML IV SOLN
INTRAVENOUS | Status: AC
  Filled 2022-06-10: qty 4

## 2022-06-10 MED ORDER — DIAZEPAM 5 MG PO TABS
ORAL_TABLET | ORAL | Status: AC
  Filled 2022-06-10: qty 2

## 2022-06-10 MED ORDER — CIPROFLOXACIN IN D5W 400 MG/200ML IV SOLN
INTRAVENOUS | Status: AC
  Filled 2022-06-10: qty 200

## 2022-06-10 MED ORDER — DIAZEPAM 5 MG PO TABS
10.0000 mg | ORAL_TABLET | ORAL | Status: AC
  Administered 2022-06-10: 10 mg via ORAL

## 2022-06-10 MED ORDER — HYDROCODONE-ACETAMINOPHEN 7.5-325 MG/15ML PO SOLN: 15.0000 mL | Freq: Four times a day (QID) | ORAL | 0 refills | Status: DC | PRN

## 2022-06-10 MED ORDER — CIPROFLOXACIN IN D5W 400 MG/200ML IV SOLN
400.0000 mg | Freq: Once | INTRAVENOUS | Status: AC
  Administered 2022-06-10: 400 mg via INTRAVENOUS

## 2022-06-10 MED ORDER — SODIUM CHLORIDE 0.9 % IV SOLN: INTRAVENOUS | Status: DC

## 2022-06-10 MED ORDER — AMISULPRIDE (ANTIEMETIC) 5 MG/2ML IV SOLN
10.0000 mg | Freq: Once | INTRAVENOUS | Status: AC
  Administered 2022-06-10: 10 mg via INTRAVENOUS

## 2022-06-10 MED ORDER — DIPHENHYDRAMINE HCL 25 MG PO CAPS: 25.0000 mg | ORAL_CAPSULE | ORAL | Status: DC

## 2022-06-10 MED ORDER — CIPROFLOXACIN HCL 500 MG PO TABS: 500.0000 mg | ORAL_TABLET | ORAL | Status: DC

## 2022-06-10 MED ORDER — DIPHENHYDRAMINE HCL 25 MG PO CAPS
ORAL_CAPSULE | ORAL | Status: AC
  Filled 2022-06-10: qty 1

## 2022-06-10 MED ORDER — CIPROFLOXACIN HCL 500 MG PO TABS
ORAL_TABLET | ORAL | Status: AC
  Filled 2022-06-10: qty 1

## 2022-06-10 NOTE — Discharge Instructions (Signed)

## 2022-06-10 NOTE — Op Note (Signed)
[facility] Operative Note  Treating Physician: Rhoderick Moody, MD  Pre-op diagnosis: 4 mm left ureteral stone   Post-op diagnosis: Same   Procedure: LEFT ESWL  See Rojelio Brenner OP note scanned into chart. Also because of the size, density, location and other factors that cannot be anticipated I feel this will likely be a staged procedure. This fact supersedes any indication in the scanned [place] stone operative note to the contrary

## 2022-06-11 ENCOUNTER — Encounter (HOSPITAL_BASED_OUTPATIENT_CLINIC_OR_DEPARTMENT_OTHER): Payer: Self-pay | Admitting: Urology

## 2022-06-11 NOTE — H&P (Signed)
See scanned [facility] documents for H&P.   

## 2022-06-22 DIAGNOSIS — N202 Calculus of kidney with calculus of ureter: Secondary | ICD-10-CM | POA: Diagnosis not present

## 2022-07-14 DIAGNOSIS — N200 Calculus of kidney: Secondary | ICD-10-CM | POA: Diagnosis not present

## 2022-07-14 DIAGNOSIS — R8271 Bacteriuria: Secondary | ICD-10-CM | POA: Diagnosis not present

## 2022-08-04 ENCOUNTER — Other Ambulatory Visit: Payer: Self-pay

## 2022-08-04 ENCOUNTER — Other Ambulatory Visit: Payer: Self-pay | Admitting: Urology

## 2022-08-04 ENCOUNTER — Encounter (HOSPITAL_BASED_OUTPATIENT_CLINIC_OR_DEPARTMENT_OTHER): Payer: Self-pay | Admitting: Urology

## 2022-08-04 NOTE — Progress Notes (Addendum)
Spoke w/ via phone for pre-op interview---[name] needs dos----none              [name] results------05/26/22 EKG in chart & Epic COVID test -----patient states asymptomatic no test needed Arrive at -------0645 on Thursday, 08/12/22 NPO after MN NO Solid Food.  Clear liquids from MN until---0545 Med rec completed Medications to take morning of surgery -----Claritin Diabetic medication -----n/a Patient instructed no nail polish to be worn day of surgery Patient instructed to bring photo id and insurance card day of surgery Patient aware to have Driver (ride ) / caregiver    for 24 hours after surgery - [name] & [name] Patient Special Instructions -----No vaping for 24 hours prior to surgery. Pre-Op special Istructions -----Patient wears hearing aids. Requested second sign on orders from [name] via [facility] on 08/04/22. Patient verbalized understanding of instructions that were given at this phone interview. Patient denies shortness of breath, chest pain, fever, cough at this phone interview.  FYI: Patient is adopted and has no knowledge of his family history.  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 In 2010, he had jaw surgery, pharyngoplasty and fistula repair. Reviewed case with [name], MDA on 08/04/22. Per [name], ok to proceed with surgery at [facility].

## 2022-08-11 NOTE — Anesthesia Preprocedure Evaluation (Signed)
Anesthesia Evaluation  Patient identified by MRN, date of birth, ID band Patient awake    Reviewed: Allergy & Precautions, NPO status , Patient's Chart, lab work & pertinent test results  Airway Mallampati: II  TM Distance: >3 FB Neck ROM: Full    Dental no notable dental hx. (+) Teeth Intact, Dental Advisory Given   Pulmonary Patient abstained from smoking., former smoker Hx of lung CA   Pulmonary exam normal breath sounds clear to auscultation       Cardiovascular Exercise Tolerance: Good Normal cardiovascular exam Rhythm:Regular Rate:Normal     Neuro/Psych  PSYCHIATRIC DISORDERS  Depression       GI/Hepatic ,GERD  ,,(+) Hepatitis -, B  Endo/Other  negative endocrine ROS    Renal/GU Renal diseasenephrolithiasis     Musculoskeletal   Abdominal  (+) + obese  Peds  Hematology   Anesthesia Other Findings   Reproductive/Obstetrics                             Anesthesia Physical Anesthesia Plan  ASA: 3  Anesthesia Plan: General   Post-op Pain Management:    Induction: Intravenous  PONV Risk Score and Plan: Treatment may vary due to age or medical condition, Midazolam and Ondansetron  Airway Management Planned: LMA  Additional Equipment: None  Intra-op Plan:   Post-operative Plan:   Informed Consent:      Dental advisory given  Plan Discussed with:   Anesthesia Plan Comments: (Ureteral stone)       Anesthesia Quick Evaluation

## 2022-08-12 ENCOUNTER — Other Ambulatory Visit: Payer: Self-pay

## 2022-08-12 ENCOUNTER — Ambulatory Visit (HOSPITAL_BASED_OUTPATIENT_CLINIC_OR_DEPARTMENT_OTHER): Payer: BLUE CROSS/BLUE SHIELD | Admitting: Anesthesiology

## 2022-08-12 ENCOUNTER — Ambulatory Visit (HOSPITAL_COMMUNITY): Payer: BLUE CROSS/BLUE SHIELD

## 2022-08-12 ENCOUNTER — Encounter (HOSPITAL_BASED_OUTPATIENT_CLINIC_OR_DEPARTMENT_OTHER): Payer: Self-pay | Admitting: Urology

## 2022-08-12 ENCOUNTER — Ambulatory Visit (HOSPITAL_BASED_OUTPATIENT_CLINIC_OR_DEPARTMENT_OTHER)
Admission: RE | Admit: 2022-08-12 | Discharge: 2022-08-12 | Disposition: A | Discharge status: Home / Self Care | Payer: BLUE CROSS/BLUE SHIELD | Attending: Urology | Admitting: Urology

## 2022-08-12 ENCOUNTER — Encounter (HOSPITAL_BASED_OUTPATIENT_CLINIC_OR_DEPARTMENT_OTHER)
Admission: RE | Disposition: A | Discharge status: Home / Self Care | Payer: Self-pay | Source: Home / Self Care | Attending: Urology

## 2022-08-12 DIAGNOSIS — Z6833 Body mass index (BMI) 33.0-33.9, adult: Secondary | ICD-10-CM | POA: Insufficient documentation

## 2022-08-12 DIAGNOSIS — K219 Gastro-esophageal reflux disease without esophagitis: Secondary | ICD-10-CM | POA: Insufficient documentation

## 2022-08-12 DIAGNOSIS — E669 Obesity, unspecified: Secondary | ICD-10-CM | POA: Diagnosis not present

## 2022-08-12 DIAGNOSIS — F17290 Nicotine dependence, other tobacco product, uncomplicated: Secondary | ICD-10-CM | POA: Diagnosis not present

## 2022-08-12 DIAGNOSIS — K760 Fatty (change of) liver, not elsewhere classified: Secondary | ICD-10-CM | POA: Diagnosis not present

## 2022-08-12 DIAGNOSIS — F32A Depression, unspecified: Secondary | ICD-10-CM | POA: Insufficient documentation

## 2022-08-12 DIAGNOSIS — B1910 Unspecified viral hepatitis B without hepatic coma: Secondary | ICD-10-CM | POA: Diagnosis not present

## 2022-08-12 DIAGNOSIS — N200 Calculus of kidney: Secondary | ICD-10-CM

## 2022-08-12 DIAGNOSIS — N132 Hydronephrosis with renal and ureteral calculous obstruction: Secondary | ICD-10-CM | POA: Insufficient documentation

## 2022-08-12 DIAGNOSIS — R109 Unspecified abdominal pain: Secondary | ICD-10-CM | POA: Diagnosis not present

## 2022-08-12 DIAGNOSIS — Z87442 Personal history of urinary calculi: Secondary | ICD-10-CM | POA: Diagnosis not present

## 2022-08-12 DIAGNOSIS — Z01818 Encounter for other preprocedural examination: Secondary | ICD-10-CM

## 2022-08-12 HISTORY — DX: Presence of spectacles and contact lenses: Z97.3

## 2022-08-12 HISTORY — DX: Gastro-esophageal reflux disease without esophagitis: K21.9

## 2022-08-12 HISTORY — DX: Cardiac murmur, unspecified: R01.1

## 2022-08-12 HISTORY — PX: CYSTOSCOPY/URETEROSCOPY/HOLMIUM LASER/STENT PLACEMENT: SHX6546

## 2022-08-12 HISTORY — DX: Presence of external hearing-aid: Z97.4

## 2022-08-12 HISTORY — DX: Hepatic fibrosis, unspecified: K74.00

## 2022-08-12 HISTORY — DX: Unspecified cleft palate with unilateral cleft lip: Q37.9

## 2022-08-12 SURGERY — CYSTOSCOPY/URETEROSCOPY/HOLMIUM LASER/STENT PLACEMENT
Anesthesia: General | Site: Ureter | Laterality: Left

## 2022-08-12 MED ORDER — PROPOFOL 10 MG/ML IV BOLUS
INTRAVENOUS | Status: DC | PRN
  Administered 2022-08-12: 200 mg via INTRAVENOUS
  Administered 2022-08-12: 150 mg via INTRAVENOUS

## 2022-08-12 MED ORDER — ONDANSETRON HCL 4 MG/2ML IJ SOLN
INTRAMUSCULAR | Status: DC | PRN
  Administered 2022-08-12: 4 mg via INTRAVENOUS

## 2022-08-12 MED ORDER — DEXAMETHASONE SODIUM PHOSPHATE 10 MG/ML IJ SOLN
INTRAMUSCULAR | Status: AC
  Filled 2022-08-12: qty 1

## 2022-08-12 MED ORDER — MIDAZOLAM HCL 2 MG/2ML IJ SOLN
INTRAMUSCULAR | Status: AC
  Filled 2022-08-12: qty 2

## 2022-08-12 MED ORDER — LIDOCAINE HCL (PF) 2 % IJ SOLN
INTRAMUSCULAR | Status: AC
  Filled 2022-08-12: qty 5

## 2022-08-12 MED ORDER — KETOROLAC TROMETHAMINE 30 MG/ML IJ SOLN
INTRAMUSCULAR | Status: DC | PRN
  Administered 2022-08-12: 30 mg via INTRAVENOUS

## 2022-08-12 MED ORDER — ONDANSETRON HCL 4 MG/2ML IJ SOLN
INTRAMUSCULAR | Status: AC
  Filled 2022-08-12: qty 2

## 2022-08-12 MED ORDER — LACTATED RINGERS IV SOLN: INTRAVENOUS | Status: DC

## 2022-08-12 MED ORDER — AMISULPRIDE (ANTIEMETIC) 5 MG/2ML IV SOLN: 10.0000 mg | Freq: Once | INTRAVENOUS | Status: DC | PRN

## 2022-08-12 MED ORDER — DEXAMETHASONE SODIUM PHOSPHATE 10 MG/ML IJ SOLN
INTRAMUSCULAR | Status: DC | PRN
  Administered 2022-08-12 (×2): 5 mg via INTRAVENOUS

## 2022-08-12 MED ORDER — CIPROFLOXACIN HCL 500 MG PO TABS: 500.0000 mg | ORAL_TABLET | Freq: Once | ORAL | 0 refills | Status: AC

## 2022-08-12 MED ORDER — KETOROLAC TROMETHAMINE 30 MG/ML IJ SOLN
INTRAMUSCULAR | Status: AC
  Filled 2022-08-12: qty 1

## 2022-08-12 MED ORDER — PHENAZOPYRIDINE HCL 200 MG PO TABS: 200.0000 mg | ORAL_TABLET | Freq: Three times a day (TID) | ORAL | 0 refills | Status: DC | PRN

## 2022-08-12 MED ORDER — FENTANYL CITRATE (PF) 100 MCG/2ML IJ SOLN
INTRAMUSCULAR | Status: DC | PRN
  Administered 2022-08-12: 25 ug via INTRAVENOUS
  Administered 2022-08-12: 75 ug via INTRAVENOUS

## 2022-08-12 MED ORDER — SODIUM CHLORIDE 0.9 % IR SOLN
Status: DC | PRN
  Administered 2022-08-12: 3000 mL

## 2022-08-12 MED ORDER — KETOROLAC TROMETHAMINE 10 MG PO TABS: 10.0000 mg | ORAL_TABLET | Freq: Four times a day (QID) | ORAL | 0 refills | Status: DC | PRN

## 2022-08-12 MED ORDER — IOHEXOL 300 MG/ML  SOLN
INTRAMUSCULAR | Status: DC | PRN
  Administered 2022-08-12: 10 mL via INTRATHECAL

## 2022-08-12 MED ORDER — 0.9 % SODIUM CHLORIDE (POUR BTL) OPTIME
TOPICAL | Status: DC | PRN
  Administered 2022-08-12: 500 mL

## 2022-08-12 MED ORDER — CEFAZOLIN SODIUM-DEXTROSE 2-4 GM/100ML-% IV SOLN
INTRAVENOUS | Status: AC
  Filled 2022-08-12: qty 100

## 2022-08-12 MED ORDER — PROPOFOL 10 MG/ML IV BOLUS
INTRAVENOUS | Status: AC
  Filled 2022-08-12: qty 20

## 2022-08-12 MED ORDER — HYDROMORPHONE HCL 1 MG/ML IJ SOLN: 0.2500 mg | INTRAMUSCULAR | Status: DC | PRN

## 2022-08-12 MED ORDER — OXYCODONE HCL 5 MG/5ML PO SOLN: 5.0000 mg | Freq: Once | ORAL | Status: DC | PRN

## 2022-08-12 MED ORDER — ONDANSETRON HCL 4 MG/2ML IJ SOLN: 4.0000 mg | Freq: Once | INTRAMUSCULAR | Status: DC | PRN

## 2022-08-12 MED ORDER — FENTANYL CITRATE (PF) 100 MCG/2ML IJ SOLN
INTRAMUSCULAR | Status: AC
  Filled 2022-08-12: qty 2

## 2022-08-12 MED ORDER — ACETAMINOPHEN 10 MG/ML IV SOLN: 1000.0000 mg | Freq: Once | INTRAVENOUS | Status: DC | PRN

## 2022-08-12 MED ORDER — DEXMEDETOMIDINE HCL IN NACL 80 MCG/20ML IV SOLN
INTRAVENOUS | Status: AC
  Filled 2022-08-12: qty 20

## 2022-08-12 MED ORDER — CEFAZOLIN SODIUM-DEXTROSE 2-4 GM/100ML-% IV SOLN
2.0000 g | INTRAVENOUS | Status: AC
  Administered 2022-08-12: 2 g via INTRAVENOUS

## 2022-08-12 MED ORDER — DEXMEDETOMIDINE HCL IN NACL 80 MCG/20ML IV SOLN
INTRAVENOUS | Status: DC | PRN
  Administered 2022-08-12: 8 ug via BUCCAL
  Administered 2022-08-12: 4 ug via BUCCAL

## 2022-08-12 MED ORDER — OXYCODONE HCL 5 MG PO TABS: 5.0000 mg | ORAL_TABLET | Freq: Once | ORAL | Status: DC | PRN

## 2022-08-12 MED ORDER — LIDOCAINE 2% (20 MG/ML) 5 ML SYRINGE
INTRAMUSCULAR | Status: DC | PRN
  Administered 2022-08-12: 60 mg via INTRAVENOUS

## 2022-08-12 MED ORDER — MIDAZOLAM HCL 2 MG/2ML IJ SOLN
INTRAMUSCULAR | Status: DC | PRN
  Administered 2022-08-12: 2 mg via INTRAVENOUS

## 2022-08-12 SURGICAL SUPPLY — 24 items
APL SKNCLS STERI-STRIP NONHPOA (GAUZE/BANDAGES/DRESSINGS) ×1
BAG DRAIN URO-CYSTO SKYTR STRL (DRAIN) ×1 IMPLANT
BAG DRN UROCATH (DRAIN) ×1
BASKET LASER NITINOL 1.9FR (BASKET) IMPLANT
BENZOIN TINCTURE PRP APPL 2/3 (GAUZE/BANDAGES/DRESSINGS) IMPLANT
BSKT STON RTRVL 120 1.9FR (BASKET)
CATH URETERAL DUAL LUMEN 10F (MISCELLANEOUS) IMPLANT
CATH URETL OPEN 5X70 (CATHETERS) ×1 IMPLANT
CLOTH BEACON ORANGE TIMEOUT ST (SAFETY) ×1 IMPLANT
DRSG TEGADERM 2-3/8X2-3/4 SM (GAUZE/BANDAGES/DRESSINGS) IMPLANT
EXTRACTOR STONE 1.7FRX115CM (UROLOGICAL SUPPLIES) IMPLANT
GLOVE BIO SURGEON STRL SZ7.5 (GLOVE) ×1 IMPLANT
GOWN STRL REUS W/TWL XL LVL3 (GOWN DISPOSABLE) ×1 IMPLANT
GUIDEWIRE STR DUAL SENSOR (WIRE) ×1 IMPLANT
IV NS IRRIG 3000ML ARTHROMATIC (IV SOLUTION) ×2 IMPLANT
KIT TURNOVER CYSTO (KITS) ×1 IMPLANT
MANIFOLD NEPTUNE II (INSTRUMENTS) ×1 IMPLANT
NS IRRIG 500ML POUR BTL (IV SOLUTION) ×1 IMPLANT
PACK CYSTO (CUSTOM PROCEDURE TRAY) ×1 IMPLANT
STENT URET 6FRX24 CONTOUR (STENTS) IMPLANT
TRACTIP FLEXIVA PULS ID 200XHI (Laser) IMPLANT
TRACTIP FLEXIVA PULSE ID 200 (Laser)
TUBE CONNECTING 12X1/4 (SUCTIONS) IMPLANT
TUBING UROLOGY SET (TUBING) ×1 IMPLANT

## 2022-08-12 NOTE — Anesthesia Postprocedure Evaluation (Signed)
Anesthesia Post Note  Patient: [name]  Procedure(s) Performed: CYSTOSCOPY/LEFT URETEROSCOPY/HOLMIUM LASER/LEFT RETROGRADE PYELOGRAM/STENT PLACEMENT (Left: Ureter)     Patient location during evaluation: PACU Anesthesia Type: General Level of consciousness: awake and alert Pain management: pain level controlled Vital Signs Assessment: post-procedure vital signs reviewed and stable Respiratory status: spontaneous breathing, nonlabored ventilation, respiratory function stable and patient connected to nasal cannula oxygen Cardiovascular status: blood pressure returned to baseline and stable Postop Assessment: no apparent nausea or vomiting Anesthetic complications: no  No notable events documented.  Last Vitals:  Vitals:   08/12/22 1119 08/12/22 1145  BP:  118/77  Pulse: 68 60  Resp: 14 14  Temp:  (!) 36.3 C  SpO2: 98% 97%    Last Pain:  Vitals:   08/12/22 1145  TempSrc:   PainSc: 0-No pain                 Barnet Glasgow

## 2022-08-12 NOTE — Discharge Instructions (Addendum)
DISCHARGE INSTRUCTIONS FOR KIDNEY STONE/URETERAL STENT   MEDICATIONS:  1.  Resume all your other meds from home - except do not take any extra narcotic pain meds that you may have at home.  2. Pyridium is to help with the burning/stinging when you urinate. 3. Ketoralac is for moderate/severe pain, otherwise taking upto 1000 mg every 6 hours of plainTylenol will help treat your pain.   4. Take Cipro one hour prior to removal of your stent.   ACTIVITY:  1. No strenuous activity x 1week  2. No driving while on narcotic pain medications  3. Drink plenty of water  4. Continue to walk at home - you can still get blood clots when you are at home, so keep active, but don't over do it.  5. May return to work/school tomorrow or when you feel ready   BATHING:  1. You can shower and we recommend daily showers  2. You have a string coming from your urethra: The stent string is attached to your ureteral stent. Do not pull on this.   SIGNS/SYMPTOMS TO CALL:  Please call us if you have a fever greater than 101.5, uncontrolled nausea/vomiting, uncontrolled pain, dizziness, unable to urinate, bloody urine, chest pain, shortness of breath, leg swelling, leg pain, redness around wound, drainage from wound, or any other concerns or questions.   You can reach [place] at [phone].   FOLLOW-UP:  1. You have an appointment in 3 weeks with a ultrasound of your kidneys prior.   2. You have a string attached to your stent, you may remove it on Monday, Feb 5th. To do this, pull the strings until the stents are completely removed. You may feel an odd sensation in your back.    Post Anesthesia Home Care Instructions  Activity: Get plenty of rest for the remainder of the day. A responsible individual must stay with you for 24 hours following the procedure.  For the next 24 hours, DO NOT: -Drive a car -Paediatric nurse -Drink alcoholic beverages -Take any medication unless instructed by your physician -Make any  legal decisions or sign important papers.  Meals: Start with liquid foods such as gelatin or soup. Progress to regular foods as tolerated. Avoid greasy, spicy, heavy foods. If nausea and/or vomiting occur, drink only clear liquids until the nausea and/or vomiting subsides. Call your physician if vomiting continues.  Special Instructions/Symptoms: Your throat may feel dry or sore from the anesthesia or the breathing tube placed in your throat during surgery. If this causes discomfort, gargle with warm salt water. The discomfort should disappear within 24 hours.

## 2022-08-12 NOTE — Anesthesia Procedure Notes (Signed)
Procedure Name: LMA Insertion Date/Time: 08/12/2022 9:32 AM  Performed by: Suan Halter, CRNAPre-anesthesia Checklist: Patient identified, Emergency Drugs available, Suction available and Patient being monitored Patient Re-evaluated:Patient Re-evaluated prior to induction Oxygen Delivery Method: Circle system utilized Preoxygenation: Pre-oxygenation with 100% oxygen Induction Type: IV induction Ventilation: Mask ventilation without difficulty LMA: LMA with gastric port inserted LMA Size: 4.0 Number of attempts: 1 Airway Equipment and Method: Bite block Placement Confirmation: positive ETCO2 Tube secured with: Tape Dental Injury: Teeth and Oropharynx as per pre-operative assessment  Comments: LMA #4 Aura straight with poor seal.  Removed and replaced with LMA #4 Aura Gain with gastric port  - BBS =+, positive ETC02, good seal.

## 2022-08-12 NOTE — Interval H&P Note (Signed)
History and Physical Interval Note:  08/12/2022 8:46 AM  [name]  has presented today for surgery, with the diagnosis of LEFT RENAL STONE.  The various methods of treatment have been discussed with the patient and family. After consideration of risks, benefits and other options for treatment, the patient has consented to  Procedure(s): CYSTOSCOPY/LEFT URETEROSCOPY/HOLMIUM LASER/LEFT RETROGRADE PYELOGRAM/STENT PLACEMENT (Left) as a surgical intervention.  The patient's history has been reviewed, patient examined, no change in status, stable for surgery.  I have reviewed the patient's chart and labs.  Questions were answered to the patient's satisfaction.     Ardis Hughs

## 2022-08-12 NOTE — H&P (Signed)
32 year old male with a history of nephrolithiasis, last seen in 2019 after he passed a stone. At that time he had a 1 cm stone in the left lower pole was nonobstructing. He presented to the emergency room March 22, 2022 with gross hematuria and a CT scan demonstrated a 3 mm left UPJ stone. His pain was fairly minimal and he was able to proceed with medical expulsion therapy. However on Thanksgiving he developed severe left flank pain. He called [place] for further management and Toradol was sent in for him. This seemed to alleviate a lot of his symptoms. Since then he does not know if he has passed his stone, but his pain has been relatively mild. Denies any fevers or chills. Denies any ongoing gross hematuria.   06/22/2022:  Patient returns for KUB s/p ESWL on 06/10/2022 for 53mm left mid-ureteral stone. He is not on any medications at this time. In the interim, patient has seen fine sediment past, which she did not attempt to collect. He did continue with symptoms of ureteral colic for 1 week, after which he endorses resolution of stone pain. However, he does endorse increased frequency, urgency, and incomplete bladder sensation since, elevated from baseline. He denies dysuria, gross hematuria, fever/chills/sweats, nausea/vomiting, suprapubic pressure, flank pain.   07/14/2021:  Patient returns acutely with complaints of LLQ pain. He restarted tamsulosin last week. Symptoms began last Thursday intermittently, and since Sunday have been constant. He has not had to use oxycodone for his pain. This morning, he endorses onset of mild dysuria. He otherwise denies suprapubic pain, gross hematuria, fever/chills, nausea/vomiting, flank pain.     ALLERGIES: No Known Drug Allergies    MEDICATIONS: Alavert 10 mg tablet,disintegrating  Claritin     GU PSH: ESWL - 06/10/2022 Orchiopexy, Right, 1992       PSH Notes: Multiple cleft lip/palate surgeries, jaw surgery 2010, ear tubes (2000)   NON-GU PSH: None    GU PMH: Renal and ureteral calculus - 06/22/2022, - 06/08/2022 Renal calculus, left kidney: 1 cm posterior stone, 48mm interpolar. - 06/22/2022, - 2020      PMH Notes: Hepatic fibrosis   NON-GU PMH: Anxiety Depression GERD Hepatitis B Hypercholesterolemia Major depressive disorder, recurrent severe without psychotic features Paresthesia of skin Unspecified hearing loss, bilateral    Immunizations: None   FAMILY HISTORY: Family history not obtainable due to adoption - Runs in Family Patient's father is still living - Runs in Family patient's mother is still living - Runs in Family    Notes: Patient adopted from the [place] in 1991   SOCIAL HISTORY: Marital Status: Married Preferred Language: English; Ethnicity: Not Hispanic Or Latino Current Smoking Status: Patient does not smoke anymore. Has not smoked since 05/12/2021.   Tobacco Use Assessment Completed: Used Tobacco in last 30 days? Does not drink anymore.  Drinks 1 caffeinated drink per day. 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     Notes: Race: Other Pacific Islander  Smoking status: Vape   REVIEW OF SYSTEMS:    GU Review Male:   Patient reports burning/ pain with urination. Patient denies frequent urination, hard to postpone urination, get up at night to urinate, leakage of urine, stream starts and stops, trouble starting your stream, have to strain to urinate , erection problems, and penile pain.  Gastrointestinal (Upper):   Patient denies nausea, vomiting, and indigestion/ heartburn.  Gastrointestinal (Lower):   Patient denies diarrhea and constipation.  Constitutional:   Patient denies fever, night sweats, weight loss, and fatigue.  Skin:  Patient denies skin rash/ lesion and itching.  Eyes:   Patient denies double vision and blurred vision.  Ears/ Nose/ Throat:   Patient denies sore throat and sinus problems.  Hematologic/Lymphatic:   Patient denies swollen glands and easy bruising.  Cardiovascular:   Patient  denies leg swelling and chest pains.  Respiratory:   Patient denies cough and shortness of breath.  Endocrine:   Patient denies excessive thirst.  Musculoskeletal:   Patient denies back pain and joint pain.  Neurological:   Patient denies headaches and dizziness.  Psychologic:   Patient denies depression and anxiety.   Notes: Left lower quadrant abdominal pain.    VITAL SIGNS:      07/14/2022 10:19 AM  Weight 168 lb / 76.2 kg  Height 62 in / 157.48 cm  BP 139/76 mmHg  Pulse 69 /min  BMI 30.7 kg/m   MULTI-SYSTEM PHYSICAL EXAMINATION:    Constitutional: Well-nourished. No physical deformities. Normally developed. Good grooming.  Respiratory: No labored breathing, no use of accessory muscles.   Cardiovascular: Normal temperature, normal extremity pulses, no swelling.   Skin: No paleness, no jaundice, no cyanosis.  Neurologic / Psychiatric: Oriented to time, oriented to place, oriented to person. No depression, no anxiety, no agitation.  Gastrointestinal: Mild tenderness to palpation overlying sigmoid colon region. No mass, no suprapubic or bilateral CVA tenderness, no rigidity, non obese abdomen.   Musculoskeletal: Normal gait and station of head and neck.     Complexity of Data:  Source Of History:  Patient, Medical Record Summary  Records Review:   Previous Doctor Records, Previous Patient Records  Urine Test Review:   Urinalysis  X-Ray Review: Outside CT: Reviewed Films. Reviewed Report. Discussed With Patient.  KUB: Reviewed Films. Discussed With Patient.  Renal Ultrasound (Limited): Reviewed Films. Reviewed Report. Discussed With Patient.     07/14/22  Urinalysis  Urine Appearance Slightly Cloudy   Urine Color Yellow   Urine Glucose Neg mg/dL  Urine Bilirubin Neg mg/dL  Urine Ketones Neg mg/dL  Urine Specific Gravity 1.025   Urine Blood 2+ ery/uL  Urine pH 6.0   Urine Protein Neg mg/dL  Urine Urobilinogen 0.2 mg/dL  Urine Nitrites Neg   Urine Leukocyte Esterase Neg  leu/uL  Urine WBC/hpf 0 - 5/hpf   Urine RBC/hpf 10 - 20/hpf   Urine Epithelial Cells NS (Not Seen)   Urine Bacteria NS (Not Seen)   Urine Mucous Present   Urine Yeast NS (Not Seen)   Urine Trichomonas Not Present   Urine Cystals NS (Not Seen)   Urine Casts NS (Not Seen)   Urine Sperm Not Present   Notes:                      CLINICAL DATA: Flank pain, kidney stone suspected   EXAM:  CT ABDOMEN AND PELVIS WITHOUT CONTRAST   TECHNIQUE:  Multidetector CT imaging of the abdomen and pelvis was performed  following the standard protocol without IV contrast.   RADIATION DOSE REDUCTION: This exam was performed according to the  departmental dose-optimization program which includes automated  exposure control, adjustment of the mA and/or kV according to  patient size and/or use of iterative reconstruction technique.   COMPARISON: 2019   FINDINGS:  Lower chest: No acute abnormality.   Hepatobiliary: Hepatic steatosis. Gallbladder is contracted. No  biliary dilatation.   Pancreas: Unremarkable. No pancreatic ductal dilatation or  surrounding inflammatory changes.   Spleen: Normal in size without focal abnormality.  Adrenals/Urinary Tract: Adrenals are unremarkable. Unchanged 1 cm  stone of the left kidney posteriorly. New punctate 1 mm interpolar  stone the left. New 3 mm stone at the proximal left ureter just  below the ureteropelvic junction. Minimal hydronephrosis. The right  kidney is unremarkable. Bladder is unremarkable.   Stomach/Bowel: Stomach is within normal limits. Normal appendix.   Vascular/Lymphatic: No significant vascular abnormality on this  noncontrast study. No enlarged nodes.   Reproductive: Unremarkable.   Other: No free fluid. Abdominal wall is unremarkable.   Musculoskeletal: No acute or significant osseous abnormality.   IMPRESSION:  3 mm stone just below the left ureteropelvic junction with minimal  hydronephrosis.   Additional nonobstructing  left renal calculi.   Hepatic steatosis.    Electronically Signed  By: Macy Mis M.D.  On: 03/22/2022 14:42   PROCEDURES:         Renal Ultrasound (Limited) - [id number]  Left Kidney: Length: 10.89 cm Depth: 5.52 cm Cortical Width: 1.63 cm Width: 5.52cm    Left Kidney/Ureter:  1)0.60cm Upper Pole Stone-----2)? 0.72cm Upper Pole Stone  Bladder:  PVR = 76.80ml-----Left Ureteral Jet Visualized------? 0.37cm Hyperechoic Area in Bladder-----? Debris in Bladder      Patient confirmed No Neulasta OnPro Device.            KUB - [id number]  A single view of the abdomen is obtained. Right renal shadow well-visualized, grossly clear, consistent with prior imaging. Left renal shadow with calcification compatible with known left upper pole renal calculus seen on prior radiographs and CT. Upon closer examination, this may represent a larger stone and at least 1 adjacent smaller stone. The expected anatomical course of bilateral ureters grossly clear. Pelvic inlet with stable phlebolith pattern, without evidence of bladder calcifications.      Patient confirmed No Neulasta OnPro Device.           Urinalysis w/Scope Dipstick Dipstick Cont'd Micro  Color: Yellow Bilirubin: Neg mg/dL WBC/hpf: 0 - 5/hpf  Appearance: Slightly Cloudy Ketones: Neg mg/dL RBC/hpf: 10 - 20/hpf  Specific Gravity: 1.025 Blood: 2+ ery/uL Bacteria: NS (Not Seen)  pH: 6.0 Protein: Neg mg/dL Cystals: NS (Not Seen)  Glucose: Neg mg/dL Urobilinogen: 0.2 mg/dL Casts: NS (Not Seen)    Nitrites: Neg Trichomonas: Not Present    Leukocyte Esterase: Neg leu/uL Mucous: Present      Epithelial Cells: NS (Not Seen)      Yeast: NS (Not Seen)      Sperm: Not Present    ASSESSMENT:      ICD-10 Details  1 GU:   Low back pain - M54.5 Undiagnosed New Problem  2   Renal calculus - N20.0 Left, Chronic, Stable   PLAN:            Medications New Meds: Tamsulosin Hcl 0.4 mg capsule 1 capsule PO Daily   #30  1 Refill(s)  Ondansetron  Odt 8 mg tablet,disintegrating 1 tablet Sublingual Q 8 H PRN   #15  1 Refill(s)  Pharmacy Name:  [id number]  Address:  [address],  [address]  Phone:  [phone]  Fax:  [phone]    Stop Meds: Tramadol Hcl 50 mg tablet 1-2 tablet PO Q 6 H PRN  Start: 06/08/2022  Discontinue: 07/14/2022  - Reason: stopped           Orders Labs Urine Culture  X-Rays: KUB    Renal Ultrasound (Limited) - Left  Schedule Return Visit/Planned Activity: Other See Visit Notes - Schedule Surgery          Document Letter(s):  Created for Patient: Clinical Summary         Notes:   Today, UA with 10-20 RBCs/hpf, consistent with patient passing a stone. Due to patient's dysuria, out of an abundance of caution, and for surgical clearance, we have sent for urine culture and we will follow-up with patient as appropriate based on C&S report.   KUB without evidence of ureteral or vesicular stones. It is notable that on KUB, the left upper pole known calcification may represent more than 1 stone. A closer review of external CT SS on 03/22/2022 may be consistent with this (image 29/93). RUS also notable for possible presence of 2 stones, as well as possible debris in bladder. This is also consistent with prior CT noting punctate left interpolar stone.   Patient has resumed medical expulsive therapy. Ondansetron sent into pharmacy. Patient has enough pain medications per his report. However, he is anxious to get stone free. He requests elective left-sided ureteroscopy to achieve this.   Reviewed in detail risk and benefits to ureteroscopy, not limited to risk with general sedation, risk for untoward surgical outcomes such as injury to surrounding tissues, risk for infection, unforeseen intra surgical or postsurgical complications, requirement for staged procedure, or inability to achieve stone free status. Further stressed discomfort associated with ureteral stenting, as well  as tethered versus cystoscopic stent removal.   Continue on tamsulosin 0.4 mg daily. Advised use of NSAIDs for stone pain, reserving narcotic medications for breakthrough pain. Advised for AZO use with dysuria.   We will proceed with completing a surgical posting sheet, and submit it to urologist's scheduler. Scheduler will be in contact with patient with for scheduling requirements. Interim return to clinic advised for worsening presentation, development of irritative, obstructive, or constitutional symptoms. Patient voiced understanding and is amenable to this plan.

## 2022-08-12 NOTE — Anesthesia Procedure Notes (Signed)
Procedure Name: LMA Insertion Date/Time: 08/12/2022 9:22 AM  Performed by: Suan Halter, CRNAPre-anesthesia Checklist: Patient identified, Emergency Drugs available, Suction available and Patient being monitored Patient Re-evaluated:Patient Re-evaluated prior to induction Oxygen Delivery Method: Circle system utilized Preoxygenation: Pre-oxygenation with 100% oxygen Induction Type: IV induction Ventilation: Mask ventilation without difficulty LMA: LMA inserted LMA Size: 4.0 Number of attempts: 1 Airway Equipment and Method: Bite block Placement Confirmation: positive ETCO2 Tube secured with: Tape Dental Injury: Teeth and Oropharynx as per pre-operative assessment

## 2022-08-12 NOTE — Progress Notes (Signed)
Patient feeling urge to urinate he feels ready to get up and try.

## 2022-08-12 NOTE — Transfer of Care (Signed)
Immediate Anesthesia Transfer of Care Note  Patient: [name]  Procedure(s) Performed: Procedure(s) (LRB): CYSTOSCOPY/LEFT URETEROSCOPY/HOLMIUM LASER/LEFT RETROGRADE PYELOGRAM/STENT PLACEMENT (Left)  Patient Location: PACU  Anesthesia Type: General  Level of Consciousness: awake, oriented, sedated and patient cooperative  Airway & Oxygen Therapy: Patient Spontanous Breathing and Patient connected to face mask oxygen  Post-op Assessment: Report given to PACU RN and Post -op Vital signs reviewed and stable  Post vital signs: Reviewed and stable  Complications: No apparent anesthesia complications Last Vitals:  Vitals Value Taken Time  BP 99/70 08/12/22 1100  Temp 36.3 C 08/12/22 1021  Pulse 65 08/12/22 1109  Resp 10 08/12/22 1109  SpO2 98 % 08/12/22 1109  Vitals shown include unvalidated device data.  Last Pain:  Vitals:   08/12/22 1030  TempSrc:   PainSc: Asleep      Patients Stated Pain Goal: 5 (66/59/93 5701)  Complications: No notable events documented.

## 2022-08-12 NOTE — Op Note (Signed)
Preoperative diagnosis: left ureteral calculus  Postoperative diagnosis: Passed left ureteral stone, no additional stones within the urinary tract  Procedure:  Cystoscopy left ureteroscopy, diagnostic left 11F x 24cm ureteral stent placement  left retrograde pyelography with interpretation  Surgeon: Ardis Hughs, MD  Anesthesia: General  Complications: None  Intraoperative findings: left retrograde pyelography demonstrated normal caliber ureter with no filling defects or abnormalities.  The renal pelvis and collecting system demonstrated no hydroureteronephrosis or filling defects.  EBL: Minimal  Specimens: None  Disposition of specimens: [facility] for stone analysis  Indication: [name] is a 33 y.o.   patient with a left ureteral stone and associated left symptoms.  He underwent shockwave lithotripsy more than 2 months ago.  He did have some residual pain and discomfort.  He also had nonobstructing calcification in the left kidney.  Given that he was having some ongoing left sided flank and lower abdominal pain and his CAT scan demonstrated a nonobstructing stone in his left kidney and after reviewing the management options for treatment, the patient elected to proceed with the above surgical procedure(s). We have discussed the potential benefits and risks of the procedure, side effects of the proposed treatment, the likelihood of the patient achieving the goals of the procedure, and any potential problems that might occur during the procedure or recuperation. Informed consent has been obtained.   Description of procedure:  The patient was taken to the operating room and general anesthesia was induced.  The patient was placed in the dorsal lithotomy position, prepped and draped in the usual sterile fashion, and preoperative antibiotics were administered. A preoperative time-out was performed.   Cystourethroscopy was performed.  The patient's  urethra was examined and was normal.  The bladder was then systematically examined in its entirety. There was no evidence for any bladder tumors, stones, or other mucosal pathology.    Attention then turned to the left ureteral orifice and a ureteral catheter was used to intubate the ureteral orifice.  Omnipaque contrast was injected through the ureteral catheter and a retrograde pyelogram was performed with findings as dictated above.  A 0.38 sensor guidewire was then advanced up the left ureter into the renal pelvis under fluoroscopic guidance. The 6 Fr semirigid ureteroscope was then advanced into the ureter next to the guidewire and advanced up to the renal pelvis.  No stone was identified.  I subsequently advanced a second wire through the semirigid ureteroscope and up into the renal pelvis removing the scope over the wire.  I then advanced a single-lumen flexible ureteroscope over the second wire and was able to easily cannulate the patient's left ureteral orifice and advanced up into the renal pelvis.  I subsequently performed pyeloscopy under fluoroscopic guidance to ensure that all calyces within his kidney had been inspected.  After confirming twice that there were no additional stones within the collecting system I terminated the case.  I slowly backed out the ureteroscope demonstrating no ureteral abnormality.  The wire was then backloaded through the cystoscope and a ureteral stent was advance over the wire using Seldinger technique.  The stent was positioned appropriately under fluoroscopic and cystoscopic guidance.  The wire was then removed with an adequate stent curl noted in the renal pelvis as well as in the bladder.  The bladder was then emptied and the procedure ended.  The patient appeared to tolerate the procedure well and without complications.  The patient was able to be awakened and transferred to the recovery unit  in satisfactory condition.   Disposition: The tether of the stent  was left on and secured to the ventral aspect of the patient's penis.  Instructions for removing the stent have been provided to the patient. The patient has been scheduled for followup in 3 weeks with a renal ultrasound.

## 2022-08-13 ENCOUNTER — Encounter (HOSPITAL_BASED_OUTPATIENT_CLINIC_OR_DEPARTMENT_OTHER): Payer: Self-pay | Admitting: Urology

## 2022-11-02 DIAGNOSIS — N200 Calculus of kidney: Secondary | ICD-10-CM | POA: Diagnosis not present

## 2022-11-04 DIAGNOSIS — R0602 Shortness of breath: Secondary | ICD-10-CM | POA: Diagnosis not present

## 2022-11-17 ENCOUNTER — Other Ambulatory Visit (HOSPITAL_COMMUNITY): Payer: Self-pay | Admitting: Family Medicine

## 2022-11-17 DIAGNOSIS — Q256 Stenosis of pulmonary artery: Secondary | ICD-10-CM

## 2022-12-21 ENCOUNTER — Ambulatory Visit (HOSPITAL_COMMUNITY): Payer: BLUE CROSS/BLUE SHIELD | Attending: Family Medicine

## 2022-12-21 DIAGNOSIS — Q256 Stenosis of pulmonary artery: Secondary | ICD-10-CM | POA: Insufficient documentation

## 2022-12-21 LAB — ECHOCARDIOGRAM COMPLETE
Area-P 1/2: 3.85 cm2
S' Lateral: 3.1 cm

## 2022-12-27 DIAGNOSIS — Z03818 Encounter for observation for suspected exposure to other biological agents ruled out: Secondary | ICD-10-CM | POA: Diagnosis not present

## 2022-12-27 DIAGNOSIS — J189 Pneumonia, unspecified organism: Secondary | ICD-10-CM | POA: Diagnosis not present

## 2022-12-27 DIAGNOSIS — R051 Acute cough: Secondary | ICD-10-CM | POA: Diagnosis not present

## 2022-12-27 DIAGNOSIS — R0602 Shortness of breath: Secondary | ICD-10-CM | POA: Diagnosis not present

## 2022-12-27 DIAGNOSIS — J029 Acute pharyngitis, unspecified: Secondary | ICD-10-CM | POA: Diagnosis not present

## 2022-12-27 DIAGNOSIS — R509 Fever, unspecified: Secondary | ICD-10-CM | POA: Diagnosis not present

## 2023-01-17 DIAGNOSIS — J029 Acute pharyngitis, unspecified: Secondary | ICD-10-CM | POA: Diagnosis not present

## 2023-02-03 DIAGNOSIS — R1312 Dysphagia, oropharyngeal phase: Secondary | ICD-10-CM | POA: Diagnosis not present

## 2023-03-11 DIAGNOSIS — U071 COVID-19: Secondary | ICD-10-CM | POA: Diagnosis not present

## 2023-04-18 ENCOUNTER — Ambulatory Visit: Payer: BLUE CROSS/BLUE SHIELD

## 2023-04-18 ENCOUNTER — Encounter: Payer: Self-pay | Admitting: Pulmonary Disease

## 2023-04-18 ENCOUNTER — Ambulatory Visit: Payer: BLUE CROSS/BLUE SHIELD | Admitting: Pulmonary Disease

## 2023-04-18 VITALS — BP 122/72 | HR 84 | Temp 97.5°F | Ht 62.0 in | Wt 173.0 lb

## 2023-04-18 DIAGNOSIS — R0600 Dyspnea, unspecified: Secondary | ICD-10-CM | POA: Diagnosis not present

## 2023-04-18 DIAGNOSIS — R0602 Shortness of breath: Secondary | ICD-10-CM | POA: Diagnosis not present

## 2023-04-18 DIAGNOSIS — Z87898 Personal history of other specified conditions: Secondary | ICD-10-CM

## 2023-04-18 NOTE — Patient Instructions (Signed)
Schedule follow-up sleep study  Schedule chest x-ray today  Schedule for pulmonary function test  Graded exercise as tolerated  Tentative follow-up in about 3 months  Call us with significant concerns

## 2023-04-18 NOTE — Progress Notes (Signed)
[name]    [id number]    January 28, 1991  Primary Care Physician:Timberlake, Meridee Score, MD  Referring Physician: Shon Hale, MD [address] [place],  [place] [address]  Chief complaint:   Shortness of breath History of snoring  HPI:  Longstanding history of snoring Witnessed apneas Before he was younger and body spouse recently Noted apneas Dry mouth in the mornings, morning headaches Usually goes to bed about 10 PM, final wake up time between 4 and 6 AM Usually does not wake up in the middle of the night  Shortness of breath has been progressive over the last few months Does not exercise regularly Feeling like is not getting enough air It happens at random.  Sometimes able to do more activity than others  Has a history of cardiac rhythm problems Concern for sleep apnea long-term History of cleft palate repair  Reformed smoker quit about 2 years ago was smoking up to a pack a day  Does have irregular heartbeats, acid reflux, indigestion, anxiety, nasal congestion     Outpatient Encounter Medications as of 04/18/2023  Medication Sig   loratadine (ALAVERT) 10 MG dissolvable tablet Take 10 mg by mouth daily.   omeprazole (PRILOSEC) 40 MG capsule Take 40 mg by mouth daily as needed (gerd).   [DISCONTINUED] ketorolac (TORADOL) 10 MG tablet Take 1 tablet (10 mg total) by mouth every 6 (six) hours as needed for moderate pain. (Patient not taking: Reported on 04/18/2023)   [DISCONTINUED] loratadine (CLARITIN) 10 MG tablet Take 10 mg by mouth daily as needed for allergies. (Patient not taking: Reported on 04/18/2023)   [DISCONTINUED] phenazopyridine (PYRIDIUM) 200 MG tablet Take 1 tablet (200 mg total) by mouth 3 (three) times daily as needed for pain. (Patient not taking: Reported on 04/18/2023)   [DISCONTINUED] tamsulosin (FLOMAX) 0.4 MG CAPS capsule Take 1 capsule (0.4 mg total) by mouth daily. (Patient not taking: Reported on 08/04/2022)   No  facility-administered encounter medications on file as of 04/18/2023.    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 Carolan Clines. 06/09/22 long term heart monitor = normal   Wears glasses    Wears hearing aid     Past Surgical History:  Procedure Laterality Date   CLEFT LIP REPAIR     32 yrs old   CYSTOSCOPY/URETEROSCOPY/HOLMIUM LASER/STENT PLACEMENT Left 08/12/2022   Procedure: CYSTOSCOPY/LEFT URETEROSCOPY/HOLMIUM LASER/LEFT RETROGRADE PYELOGRAM/STENT PLACEMENT;  Surgeon: Crist Fat, MD;  Location: [facility];  Service: Urology;  Laterality: Left;   EXTRACORPOREAL SHOCK WAVE LITHOTRIPSY Left 06/10/2022   Procedure: LEFT EXTRACORPOREAL SHOCK WAVE LITHOTRIPSY (ESWL);  Surgeon: Rene Paci, MD;  Location: [facility];  Service: Urology;  Laterality: Left;  75 MINUTES NEEDED FOR CASE   MANDIBLE SURGERY  2010   OTHER SURGICAL HISTORY  2010   pharyngoplasty and fistula repair    Family History  Adopted: Yes  Family history unknown: Yes    Social History   Socioeconomic History   Marital status: Married    Spouse name: Not on file   Number of children: Not on file   Years of education: Not on file   Highest education level: Not on file  Occupational History   Not on file  Tobacco Use   Smoking status: Former    Current packs/day: 0.00    Types: Cigarettes    Quit date: 07/05/2021    Years since quitting:  1.7   Smokeless tobacco: Never  Vaping Use   Vaping status: Every Day   Substances: Nicotine  Substance and Sexual Activity   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   Sexual activity: Not on file  Other Topics Concern   Not on file  Social History Narrative   Not on file   Social Determinants of Health   Financial Resource Strain: Not on file  Food Insecurity: Not on file  Transportation Needs: Not on file  Physical Activity: Not on file  Stress: Not on file  Social Connections: Not on file  Intimate Partner Violence: Not on file    Review of Systems  Respiratory:  Positive for apnea and shortness of breath.   Psychiatric/Behavioral:  Positive for sleep disturbance.     Vitals:   04/18/23 1525  BP: 122/72  Pulse: 84  Temp: (!) 97.5 F (36.4 C)  SpO2: 98%     Physical Exam Constitutional:      Appearance: He is obese.  HENT:     Head: Normocephalic.     Mouth/Throat:     Mouth: Mucous membranes are moist.  Eyes:     General: No scleral icterus. Cardiovascular:     Rate and Rhythm: Normal rate and regular rhythm.     Heart sounds: No murmur heard.    No friction rub.  Pulmonary:     Effort: No respiratory distress.     Breath sounds: No stridor. No wheezing or rhonchi.  Musculoskeletal:     Cervical back: No rigidity or tenderness.  Neurological:     Mental Status: He is alert.  Psychiatric:        Mood and Affect: Mood normal.     Data Reviewed: Chest x-ray today significant for prominent bronchovascular markings  Assessment:  Excessive daytime sleepiness  Snoring, witnessed apneas -Moderate progress of significant sleep disordered breathing  Shortness of breath with activity -Likely multifactorial -No underlying history of asthma -Some deconditioning may be playing a  role  Plan/Recommendations:  Schedule for home sleep test  Schedule for pulmonary function test  X-ray does not show any acute infiltrative process  Encourage graded activities as tolerated  Tentative follow-up in about 3 months  Encouraged to call with significant concerns   Virl Diamond MD [facility] 04/18/2023, 3:55 PM  CC: Shon Hale, *

## 2023-05-09 DIAGNOSIS — E7800 Pure hypercholesterolemia, unspecified: Secondary | ICD-10-CM | POA: Diagnosis not present

## 2023-05-09 DIAGNOSIS — Z0000 Encounter for general adult medical examination without abnormal findings: Secondary | ICD-10-CM | POA: Diagnosis not present

## 2023-05-09 DIAGNOSIS — Z79899 Other long term (current) drug therapy: Secondary | ICD-10-CM | POA: Diagnosis not present

## 2023-05-09 DIAGNOSIS — K219 Gastro-esophageal reflux disease without esophagitis: Secondary | ICD-10-CM | POA: Diagnosis not present

## 2023-05-09 DIAGNOSIS — Z23 Encounter for immunization: Secondary | ICD-10-CM | POA: Diagnosis not present

## 2023-05-09 DIAGNOSIS — R7309 Other abnormal glucose: Secondary | ICD-10-CM | POA: Diagnosis not present

## 2023-07-25 ENCOUNTER — Ambulatory Visit: Payer: BLUE CROSS/BLUE SHIELD | Admitting: Pulmonary Disease

## 2023-07-25 ENCOUNTER — Encounter: Payer: Self-pay | Admitting: Pulmonary Disease

## 2023-07-25 VITALS — BP 118/74 | HR 85 | Temp 98.3°F | Ht 64.0 in | Wt 180.0 lb

## 2023-07-25 DIAGNOSIS — R0602 Shortness of breath: Secondary | ICD-10-CM | POA: Diagnosis not present

## 2023-07-25 LAB — PULMONARY FUNCTION TEST
DL/VA % pred: 134 %
DL/VA: 6.72 ml/min/mmHg/L
DLCO cor % pred: 110 %
DLCO cor: 28.46 ml/min/mmHg
DLCO unc % pred: 110 %
DLCO unc: 28.46 ml/min/mmHg
FEF 25-75 Post: 1.45 L/s
FEF 25-75 Pre: 1.95 L/s
FEF2575-%Change-Post: -25 %
FEF2575-%Pred-Post: 39 %
FEF2575-%Pred-Pre: 52 %
FEV1-%Change-Post: -10 %
FEV1-%Pred-Post: 58 %
FEV1-%Pred-Pre: 65 %
FEV1-Post: 2.08 L
FEV1-Pre: 2.32 L
FEV1FVC-%Change-Post: -14 %
FEV1FVC-%Pred-Pre: 91 %
FEV6-%Change-Post: 4 %
FEV6-%Pred-Post: 76 %
FEV6-%Pred-Pre: 73 %
FEV6-Post: 3.23 L
FEV6-Pre: 3.11 L
FEV6FVC-%Pred-Post: 101 %
FEV6FVC-%Pred-Pre: 101 %
FVC-%Change-Post: 4 %
FVC-%Pred-Post: 75 %
FVC-%Pred-Pre: 72 %
FVC-Post: 3.26 L
FVC-Pre: 3.11 L
Post FEV1/FVC ratio: 64 %
Post FEV6/FVC ratio: 100 %
Pre FEV1/FVC ratio: 75 %
Pre FEV6/FVC Ratio: 100 %
RV % pred: 91 %
RV: 1.2 L
TLC % pred: 80 %
TLC: 4.44 L

## 2023-07-25 NOTE — Progress Notes (Signed)
 Full PFT performed today.

## 2023-07-25 NOTE — Patient Instructions (Signed)
 Follow-up in about 6 months  If you are able to have the sleep study done, it will be helpful to make sure you do not have significant sleep apnea -Continue to focus on weight loss and regular exercises  Your breathing study is relatively normal, very minimal obstruction  Call us  with significant concerns

## 2023-07-25 NOTE — Progress Notes (Signed)
 [name]    [id number]    08-26-1990  Primary Care Physician:Timberlake, Lamarr RAMAN, MD  Referring Physician: Chrystal Lamarr RAMAN, MD [address] [place],  [place] [address]  Chief complaint:   Shortness of breath History of snoring  HPI:  Breathing still feels about the same  Sometimes gets short of breath with minimal activities  He does have a history of snoring, witnessed apneas Dry mouth in the mornings Morning headaches Has not been able to get a sleep study done yet  Shortness of breath has been progressive over the last few months Does not exercise regularly  Had questions whether this was related to when he had COVID, has had COVID twice  Feeling like is not getting enough air It happens at random.  Sometimes able to do more activity than others  Has a history of cardiac rhythm problems Concern for sleep apnea long-term History of cleft palate repair  Reformed smoker quit about 2 years ago was smoking up to a pack a day  Does have irregular heartbeats, acid reflux, indigestion, anxiety, nasal congestion     Outpatient Encounter Medications as of 07/25/2023  Medication Sig   loratadine (ALAVERT) 10 MG dissolvable tablet Take 10 mg by mouth daily.   omeprazole (PRILOSEC) 40 MG capsule Take 40 mg by mouth daily as needed (gerd).   No facility-administered encounter medications on file as of 07/25/2023.    Allergies as of 07/25/2023   (No Known Allergies)    Past Medical History:  Diagnosis Date   Cleft palate and cleft lip    repaired when patient was [age] years old   Depression    hx of depression, suicidal ideation in 2018   GERD (gastroesophageal reflux disease)    not on meds   Heart murmur    as a baby   Hepatic fibrosis    per 1991 liver biopsy, no issues since then   Hepatitis B    treated, discovered when patient was adopted and brought [place] the [place]  when he was a baby   History of kidney stones     Palpitations 05/2022   Cardiologist OV 05/24/22 with Dr. Ronal Ross. 06/09/22 long term heart monitor = normal   Wears glasses    Wears hearing aid     Past Surgical History:  Procedure Laterality Date   CLEFT LIP REPAIR     33 yrs old   CYSTOSCOPY/URETEROSCOPY/HOLMIUM LASER/STENT PLACEMENT Left 08/12/2022   Procedure: CYSTOSCOPY/LEFT URETEROSCOPY/HOLMIUM LASER/LEFT RETROGRADE PYELOGRAM/STENT PLACEMENT;  Surgeon: Cam Morene ORN, MD;  Location: [facility];  Service: Urology;  Laterality: Left;   EXTRACORPOREAL SHOCK WAVE LITHOTRIPSY Left 06/10/2022   Procedure: LEFT EXTRACORPOREAL SHOCK WAVE LITHOTRIPSY (ESWL);  Surgeon: Devere Lonni Righter, MD;  Location: [facility];  Service: Urology;  Laterality: Left;  75 MINUTES NEEDED FOR CASE   MANDIBLE SURGERY  2010   OTHER SURGICAL HISTORY  2010   pharyngoplasty and fistula repair    Family History  Adopted: Yes  Family history unknown: Yes    Social History   Socioeconomic History   Marital status: Married    Spouse name: Not on file   Number of children: Not on file   Years of education: Not on file   Highest education level: Not on file  Occupational History   Not on file  Tobacco Use   Smoking status: Former    Current packs/day: 0.00    Types:  Cigarettes    Quit date: 07/05/2021    Years since quitting: 2.0   Smokeless tobacco: Never  Vaping Use   Vaping status: Every Day   Substances: Nicotine   Substance and Sexual Activity   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   Sexual activity: Not on file  Other Topics Concern   Not on file  Social History Narrative   Not on file   Social Drivers of Health   Financial Resource Strain: Not on file  Food Insecurity: Not on file  Transportation Needs: Not on file  Physical Activity: Not on file   Stress: Not on file  Social Connections: Not on file  Intimate Partner Violence: Not on file    Review of Systems  Respiratory:  Positive for apnea and shortness of breath.   Psychiatric/Behavioral:  Positive for sleep disturbance.     Vitals:   07/25/23 1405  BP: 118/74  Pulse: 85  Temp: 98.3 F (36.8 C)  SpO2: 98%     Physical Exam Constitutional:      Appearance: He is obese.  HENT:     Head: Normocephalic.     Mouth/Throat:     Mouth: Mucous membranes are moist.  Eyes:     General: No scleral icterus. Cardiovascular:     Rate and Rhythm: Normal rate and regular rhythm.     Heart sounds: No murmur heard.    No friction rub.  Pulmonary:     Effort: No respiratory distress.     Breath sounds: No stridor. No wheezing or rhonchi.  Musculoskeletal:     Cervical back: No rigidity or tenderness.  Neurological:     Mental Status: He is alert.  Psychiatric:        Mood and Affect: Mood normal.     Data Reviewed: Chest x-ray today significant for prominent bronchovascular markings  PFT with decreased FEV1 at 65% FEV1 of FVC is normal No significant restriction based on TLC, normal diffusing capacity  Assessment:  Excessive daytime sleepiness -Moderate progress of significant obstructive sleep apnea  He does have snoring, does have witnessed apneas  Shortness of breath is likely multifactorial  -Some deconditioning may be playing a role May have long COVID as well as he had COVID x 2 in the past  lan/Recommendations:  Try and get the sleep study done  Graded activities as tolerated  Call us  with significant concerns  Follow-up in about 6 months    Jennet Epley MD [facility] 07/25/2023, 2:31 PM  CC: Chrystal Lamarr RAMAN, *

## 2023-07-25 NOTE — Patient Instructions (Signed)
 Full PFT performed today.

## 2023-08-24 DIAGNOSIS — J329 Chronic sinusitis, unspecified: Secondary | ICD-10-CM | POA: Diagnosis not present

## 2023-08-24 DIAGNOSIS — R053 Chronic cough: Secondary | ICD-10-CM | POA: Diagnosis not present

## 2023-08-24 DIAGNOSIS — R509 Fever, unspecified: Secondary | ICD-10-CM | POA: Diagnosis not present

## 2023-08-24 DIAGNOSIS — R0609 Other forms of dyspnea: Secondary | ICD-10-CM | POA: Diagnosis not present

## 2023-08-24 DIAGNOSIS — J40 Bronchitis, not specified as acute or chronic: Secondary | ICD-10-CM | POA: Diagnosis not present

## 2024-02-15 ENCOUNTER — Emergency Department

## 2024-02-15 ENCOUNTER — Emergency Department
Admission: EM | Admit: 2024-02-15 | Discharge: 2024-02-15 | Disposition: A | Discharge status: Home / Self Care | Attending: Emergency Medicine | Admitting: Emergency Medicine

## 2024-02-15 ENCOUNTER — Other Ambulatory Visit: Payer: Self-pay

## 2024-02-15 DIAGNOSIS — M25512 Pain in left shoulder: Secondary | ICD-10-CM | POA: Diagnosis not present

## 2024-02-15 DIAGNOSIS — S3991XA Unspecified injury of abdomen, initial encounter: Secondary | ICD-10-CM | POA: Diagnosis not present

## 2024-02-15 DIAGNOSIS — M4802 Spinal stenosis, cervical region: Secondary | ICD-10-CM | POA: Diagnosis not present

## 2024-02-15 DIAGNOSIS — S20219A Contusion of unspecified front wall of thorax, initial encounter: Secondary | ICD-10-CM | POA: Diagnosis not present

## 2024-02-15 DIAGNOSIS — M47814 Spondylosis without myelopathy or radiculopathy, thoracic region: Secondary | ICD-10-CM | POA: Diagnosis not present

## 2024-02-15 DIAGNOSIS — S90511A Abrasion, right ankle, initial encounter: Secondary | ICD-10-CM | POA: Diagnosis not present

## 2024-02-15 DIAGNOSIS — S299XXA Unspecified injury of thorax, initial encounter: Secondary | ICD-10-CM | POA: Diagnosis not present

## 2024-02-15 DIAGNOSIS — Y92410 Unspecified street and highway as the place of occurrence of the external cause: Secondary | ICD-10-CM | POA: Insufficient documentation

## 2024-02-15 DIAGNOSIS — Z041 Encounter for examination and observation following transport accident: Secondary | ICD-10-CM | POA: Diagnosis not present

## 2024-02-15 DIAGNOSIS — S40012A Contusion of left shoulder, initial encounter: Secondary | ICD-10-CM | POA: Diagnosis not present

## 2024-02-15 DIAGNOSIS — S3993XA Unspecified injury of pelvis, initial encounter: Secondary | ICD-10-CM | POA: Diagnosis not present

## 2024-02-15 DIAGNOSIS — S0990XA Unspecified injury of head, initial encounter: Secondary | ICD-10-CM | POA: Diagnosis not present

## 2024-02-15 DIAGNOSIS — S20211A Contusion of right front wall of thorax, initial encounter: Secondary | ICD-10-CM | POA: Diagnosis not present

## 2024-02-15 DIAGNOSIS — S199XXA Unspecified injury of neck, initial encounter: Secondary | ICD-10-CM | POA: Diagnosis not present

## 2024-02-15 DIAGNOSIS — R220 Localized swelling, mass and lump, head: Secondary | ICD-10-CM | POA: Diagnosis not present

## 2024-02-15 DIAGNOSIS — R079 Chest pain, unspecified: Secondary | ICD-10-CM | POA: Diagnosis not present

## 2024-02-15 DIAGNOSIS — M25571 Pain in right ankle and joints of right foot: Secondary | ICD-10-CM | POA: Diagnosis not present

## 2024-02-15 LAB — CBC WITH DIFFERENTIAL/PLATELET
Abs Immature Granulocytes: 0.06 K/uL (ref 0.00–0.07)
Basophils Absolute: 0.1 K/uL (ref 0.0–0.1)
Basophils Relative: 1 %
Eosinophils Absolute: 0.2 K/uL (ref 0.0–0.5)
Eosinophils Relative: 3 %
HCT: 48.5 % (ref 39.0–52.0)
Hemoglobin: 17.3 g/dL — ABNORMAL HIGH (ref 13.0–17.0)
Immature Granulocytes: 1 %
Lymphocytes Relative: 24 %
Lymphs Abs: 1.6 K/uL (ref 0.7–4.0)
MCH: 30 pg (ref 26.0–34.0)
MCHC: 35.7 g/dL (ref 30.0–36.0)
MCV: 84.2 fL (ref 80.0–100.0)
Monocytes Absolute: 0.4 K/uL (ref 0.1–1.0)
Monocytes Relative: 6 %
Neutro Abs: 4.4 K/uL (ref 1.7–7.7)
Neutrophils Relative %: 65 %
Platelets: 181 K/uL (ref 150–400)
RBC: 5.76 MIL/uL (ref 4.22–5.81)
RDW: 13.1 % (ref 11.5–15.5)
WBC: 6.6 K/uL (ref 4.0–10.5)
nRBC: 0 % (ref 0.0–0.2)

## 2024-02-15 LAB — BASIC METABOLIC PANEL WITH GFR
Anion gap: 8 (ref 5–15)
BUN: 14 mg/dL (ref 6–20)
CO2: 26 mmol/L (ref 22–32)
Calcium: 9.3 mg/dL (ref 8.9–10.3)
Chloride: 104 mmol/L (ref 98–111)
Creatinine, Ser: 0.77 mg/dL (ref 0.61–1.24)
GFR, Estimated: >60 mL/min (ref >60)
Glucose, Bld: 129 mg/dL — ABNORMAL HIGH (ref 70–99)
Potassium: 3.9 mmol/L (ref 3.5–5.1)
Sodium: 138 mmol/L (ref 135–145)

## 2024-02-15 LAB — TROPONIN I (HIGH SENSITIVITY): Troponin I (High Sensitivity): 6 ng/L (ref <18)

## 2024-02-15 MED ORDER — ONDANSETRON HCL 4 MG/2ML IJ SOLN
4.0000 mg | Freq: Once | INTRAMUSCULAR | Status: AC
  Administered 2024-02-15: 4 mg via INTRAVENOUS
  Filled 2024-02-15: qty 2

## 2024-02-15 MED ORDER — IOHEXOL 300 MG/ML  SOLN
100.0000 mL | Freq: Once | INTRAMUSCULAR | Status: AC | PRN
  Administered 2024-02-15: 100 mL via INTRAVENOUS

## 2024-02-15 MED ORDER — PROMETHAZINE HCL 25 MG/ML IJ SOLN
12.5000 mg | Freq: Once | INTRAMUSCULAR | Status: AC
  Administered 2024-02-15: 12.5 mg via INTRAMUSCULAR
  Filled 2024-02-15: qty 1

## 2024-02-15 MED ORDER — PROMETHAZINE HCL 25 MG/ML IJ SOLN
12.5000 mg | Freq: Once | INTRAMUSCULAR | Status: DC
  Filled 2024-02-15: qty 1

## 2024-02-15 MED ORDER — NAPROXEN 500 MG PO TABS: 500.0000 mg | ORAL_TABLET | Freq: Two times a day (BID) | ORAL | 2 refills | Status: AC

## 2024-02-15 MED ORDER — MORPHINE SULFATE (PF) 4 MG/ML IV SOLN
4.0000 mg | Freq: Once | INTRAVENOUS | Status: AC
  Administered 2024-02-15: 4 mg via INTRAVENOUS
  Filled 2024-02-15: qty 1

## 2024-02-15 MED ORDER — TRAMADOL HCL 50 MG PO TABS: 50.0000 mg | ORAL_TABLET | Freq: Four times a day (QID) | ORAL | 0 refills | Status: AC | PRN

## 2024-02-15 MED ORDER — BACITRACIN ZINC 500 UNIT/GM EX OINT
TOPICAL_OINTMENT | Freq: Once | CUTANEOUS | Status: AC
  Administered 2024-02-15: 1 via TOPICAL
  Filled 2024-02-15: qty 0.9

## 2024-02-15 NOTE — ED Notes (Signed)
 MD aware pt c/o lightheadedness after returning from CT.
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
## 2024-02-15 NOTE — ED Notes (Signed)
 Pt back to room from CT

## 2024-02-15 NOTE — ED Notes (Signed)
 C collar placed per [facility] MD
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
## 2024-02-16 DIAGNOSIS — S82892A Other fracture of left lower leg, initial encounter for closed fracture: Secondary | ICD-10-CM | POA: Diagnosis not present

## 2024-02-16 DIAGNOSIS — M7989 Other specified soft tissue disorders: Secondary | ICD-10-CM | POA: Diagnosis not present

## 2024-02-16 DIAGNOSIS — S93409A Sprain of unspecified ligament of unspecified ankle, initial encounter: Secondary | ICD-10-CM | POA: Diagnosis not present

## 2024-02-17 DIAGNOSIS — S93491A Sprain of other ligament of right ankle, initial encounter: Secondary | ICD-10-CM | POA: Diagnosis not present

## 2024-02-17 DIAGNOSIS — S8261XA Displaced fracture of lateral malleolus of right fibula, initial encounter for closed fracture: Secondary | ICD-10-CM | POA: Diagnosis not present

## 2024-02-24 DIAGNOSIS — S9002XA Contusion of left ankle, initial encounter: Secondary | ICD-10-CM | POA: Diagnosis not present

## 2024-02-24 DIAGNOSIS — M25562 Pain in left knee: Secondary | ICD-10-CM | POA: Diagnosis not present

## 2024-02-24 DIAGNOSIS — M79672 Pain in left foot: Secondary | ICD-10-CM | POA: Diagnosis not present

## 2024-02-24 DIAGNOSIS — M25572 Pain in left ankle and joints of left foot: Secondary | ICD-10-CM | POA: Diagnosis not present

## 2024-03-14 DIAGNOSIS — M25562 Pain in left knee: Secondary | ICD-10-CM | POA: Diagnosis not present

## 2024-03-14 DIAGNOSIS — M25522 Pain in left elbow: Secondary | ICD-10-CM | POA: Diagnosis not present

## 2024-03-14 DIAGNOSIS — R42 Dizziness and giddiness: Secondary | ICD-10-CM | POA: Diagnosis not present

## 2024-03-19 DIAGNOSIS — S93491D Sprain of other ligament of right ankle, subsequent encounter: Secondary | ICD-10-CM | POA: Diagnosis not present

## 2024-03-19 DIAGNOSIS — S9002XD Contusion of left ankle, subsequent encounter: Secondary | ICD-10-CM | POA: Diagnosis not present

## 2024-03-19 DIAGNOSIS — S8261XD Displaced fracture of lateral malleolus of right fibula, subsequent encounter for closed fracture with routine healing: Secondary | ICD-10-CM | POA: Diagnosis not present

## 2024-03-22 DIAGNOSIS — M25671 Stiffness of right ankle, not elsewhere classified: Secondary | ICD-10-CM | POA: Diagnosis not present

## 2024-03-22 DIAGNOSIS — M25571 Pain in right ankle and joints of right foot: Secondary | ICD-10-CM | POA: Diagnosis not present

## 2024-03-30 DIAGNOSIS — M25671 Stiffness of right ankle, not elsewhere classified: Secondary | ICD-10-CM | POA: Diagnosis not present

## 2024-03-30 DIAGNOSIS — M25571 Pain in right ankle and joints of right foot: Secondary | ICD-10-CM | POA: Diagnosis not present

## 2024-04-03 DIAGNOSIS — M1712 Unilateral primary osteoarthritis, left knee: Secondary | ICD-10-CM | POA: Diagnosis not present

## 2024-04-03 DIAGNOSIS — M7052 Other bursitis of knee, left knee: Secondary | ICD-10-CM | POA: Diagnosis not present

## 2024-04-05 DIAGNOSIS — M25562 Pain in left knee: Secondary | ICD-10-CM | POA: Diagnosis not present
